# Patient Record
Sex: Female | Born: 1995 | Race: Black or African American | Hispanic: No | Marital: Single | Smoking: Never smoker
Health system: Southern US, Community
[De-identification: ages and names within clinical notes are randomized; demographics above are authoritative.]

## PROBLEM LIST (undated history)

## (undated) DIAGNOSIS — R51 Headache: Secondary | ICD-10-CM

## (undated) HISTORY — DX: Headache: R51

---
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
## 2015-04-10 ENCOUNTER — Emergency Department (HOSPITAL_COMMUNITY)
Admission: EM | Admit: 2015-04-10 | Discharge: 2015-04-10 | Disposition: A | Discharge status: Home / Self Care | Payer: Medicaid Other | Attending: Emergency Medicine | Admitting: Emergency Medicine

## 2015-04-10 ENCOUNTER — Encounter (HOSPITAL_COMMUNITY): Payer: Self-pay | Admitting: Emergency Medicine

## 2015-04-10 DIAGNOSIS — Z792 Long term (current) use of antibiotics: Secondary | ICD-10-CM | POA: Insufficient documentation

## 2015-04-10 DIAGNOSIS — F419 Anxiety disorder, unspecified: Secondary | ICD-10-CM | POA: Insufficient documentation

## 2015-04-10 DIAGNOSIS — L729 Follicular cyst of the skin and subcutaneous tissue, unspecified: Secondary | ICD-10-CM | POA: Diagnosis present

## 2015-04-10 DIAGNOSIS — L0501 Pilonidal cyst with abscess: Secondary | ICD-10-CM | POA: Diagnosis not present

## 2015-04-10 LAB — I-STAT CHEM 8, ED
BUN: 10 mg/dL (ref 6–20)
CALCIUM ION: 1.23 mmol/L (ref 1.12–1.23)
Chloride: 101 mmol/L (ref 101–111)
Creatinine, Ser: 0.7 mg/dL (ref 0.44–1.00)
Glucose, Bld: 104 mg/dL — ABNORMAL HIGH (ref 65–99)
HEMATOCRIT: 35 % — AB (ref 36.0–46.0)
HEMOGLOBIN: 11.9 g/dL — AB (ref 12.0–15.0)
Potassium: 3.8 mmol/L (ref 3.5–5.1)
Sodium: 139 mmol/L (ref 135–145)
TCO2: 24 mmol/L (ref 0–100)

## 2015-04-10 MED ORDER — OXYCODONE-ACETAMINOPHEN 5-325 MG PO TABS
1.0000 | ORAL_TABLET | Freq: Once | ORAL | Status: AC
  Administered 2015-04-10: 1 via ORAL
  Filled 2015-04-10: qty 1

## 2015-04-10 MED ORDER — LIDOCAINE-EPINEPHRINE (PF) 2 %-1:200000 IJ SOLN
10.0000 mL | Freq: Once | INTRAMUSCULAR | Status: AC
  Administered 2015-04-10: 10 mL
  Filled 2015-04-10: qty 20

## 2015-04-10 MED ORDER — ONDANSETRON 4 MG PO TBDP
4.0000 mg | ORAL_TABLET | Freq: Once | ORAL | Status: AC
  Administered 2015-04-10: 4 mg via ORAL
  Filled 2015-04-10: qty 1

## 2015-04-10 MED ORDER — OXYCODONE-ACETAMINOPHEN 5-325 MG PO TABS: 1.0000 | ORAL_TABLET | Freq: Four times a day (QID) | ORAL | Status: DC | PRN

## 2015-04-10 MED ORDER — LIDOCAINE-PRILOCAINE 2.5-2.5 % EX CREA
TOPICAL_CREAM | Freq: Once | CUTANEOUS | Status: AC
  Administered 2015-04-10: 12:00:00 via TOPICAL
  Filled 2015-04-10: qty 5

## 2015-04-10 NOTE — Discharge Instructions (Signed)
°  Pilonidal Cyst, Care After °A pilonidal cyst occurs when hairs get trapped (ingrown) beneath the skin in the crease between the buttocks over your sacrum (the bone under that crease). Pilonidal cysts are most common in young men with a lot of body hair. When the cyst breaks(ruptured) or leaks, fluid from the cyst may cause burning and itching. If the cyst becomes infected, it causes a painful swelling filled with pus (abscess). The pus and trapped hairs need to be removed (often by lancing) so that the infection can heal. The word pilonidal means hair nest. °HOME CARE INSTRUCTIONS °If the pilonidal sinus was NOT DRAINING OR LANCED: °· Keep the area clean and dry. Bathe or shower daily. Wash the area well with a germ-killing soap. Hot tub baths may help prevent infection. Dry the area well with a towel. °· Avoid tight clothing in order to keep area as moisture-free as possible. °· Keep area between buttocks as free from hair as possible. A depilatory may be used. °· Take antibiotics as directed. °· Only take over-the-counter or prescription medicines for pain, discomfort, or fever as directed by your caregiver. °If the cyst WAS INFECTED AND NEEDED TO BE DRAINED: °· Your caregiver may have packed the wound with gauze to keep the wound open. This allows the wound to heal from the inside outward and continue to drain. °· Return as directed for a wound check. °· If you take tub baths or showers, repack the wound with gauze as directed following. Sponge baths are a good alternative. Sitz baths may be used three to four times a day or as directed. °· If an antibiotic was ordered to fight the infection, take as directed. °· Only take over-the-counter or prescription medicines for pain, discomfort, or fever as directed by your caregiver. °· If a drain was in place and removed, use sitz baths for 20 minutes 4 times per day. Clean the wound gently with mild unscented soap, pat dry, and then apply a dry dressing as  directed. °If you had surgery and IT WAS MARSUPIALIZED (LEFT OPEN): °· Your wound was packed with gauze to keep the wound open. This allows the wound to heal from the inside outwards and continue draining. The changing of the dressing regularly also helps keep the wound clean. °· Return as directed for a wound check. °· If you take tub baths or showers, repack the wound with gauze as directed following. Sponge baths are a good alternative. Sitz baths can also be used. This may be done three to four times a day or as directed. °· If an antibiotic was ordered to fight the infection, take as directed. °· Only take over-the-counter or prescription medicines for pain, discomfort, or fever as directed by your caregiver. °· If you had surgery and the wound was closed you may care for it as directed. This generally includes keeping it dry and clean and dressing it as directed. °SEEK MEDICAL CARE IF:  °· You have increased pain, swelling, redness, drainage, or bleeding from the area. °· You have a fever. °· You have muscles aches, dizziness, or a general ill feeling. °Document Released: 11/30/2006 Document Revised: 07/02/2013 Document Reviewed: 02/14/2007 °ExitCare® Patient Information ©2015 [employer]. This information is not intended to replace advice given to you by your health care provider. Make sure you discuss any questions you have with your health care provider. ° ° °

## 2015-04-10 NOTE — ED Provider Notes (Signed)
CSN: [id number]     Arrival date & time 04/10/15  1028 History   First MD Initiated Contact with Patient 04/10/15 1039     Chief Complaint  Patient presents with  . Cyst     (Consider location/radiation/quality/duration/timing/severity/associated sxs/prior Treatment) HPI    PCP: DEES,JANET L, MD Blood pressure 125/66, pulse 73, temperature 98.6 F (37 C), temperature source Oral, resp. rate 20, last menstrual period 03/14/2015, SpO2 98 %.  Erica Montes is a 19 y.o.female with a significant PMH of headache presents to the ER with complaints of fell two weeks ago and landed on her tailbone. She started to develop a pain and bump to the area. She was seen at an ER in [place], xrays and started on abx. She continues to have pain and now is having drainage and worsening pain from the area. She is now having drainage and a foul odor from the site. She has not had fevers, N/V/D, no rectal pain or pain with bowel movement. Denies having this in the past. Denies polyuria or any other associated symptoms. Pain is severe and patient is very anxious.  Past Medical History  Diagnosis Date  . Headache(784.0)    History reviewed. No pertinent past surgical history. Family History  Problem Relation Age of Onset  . Diabetes Other     Maternal side  . High blood pressure Other     Maternal side   . Arthritis Other     Maternal side    History  Substance Use Topics  . Smoking status: Never Smoker   . Smokeless tobacco: Never Used  . Alcohol Use: No   OB History    No data available     Review of Systems  10 Systems reviewed and are negative for acute change except as noted in the HPI.     Allergies  Review of patient's allergies indicates no known allergies.  Home Medications   Prior to Admission medications   Medication Sig Start Date End Date Taking? Authorizing Provider  bacitracin ointment Apply 1 application topically 4 (four) times daily.   Yes Historical  Provider, MD  clindamycin (CLEOCIN) 300 MG capsule Take 300 mg by mouth 4 (four) times daily.   Yes Historical Provider, MD  naproxen sodium (ANAPROX) 220 MG tablet Take 220 mg by mouth 2 (two) times daily as needed (pain).   Yes Historical Provider, MD  traMADol-acetaminophen (ULTRACET) 37.5-325 MG per tablet Take 1 tablet by mouth every 6 (six) hours as needed (pain).   Yes Historical Provider, MD   BP 125/66 mmHg  Pulse 73  Temp(Src) 98.6 F (37 C) (Oral)  Resp 20  SpO2 98%  LMP 03/14/2015 Physical Exam  Constitutional: She appears well-developed and well-nourished. No distress.  HENT:  Head: Normocephalic and atraumatic.  Eyes: Pupils are equal, round, and reactive to light.  Neck: Normal range of motion. Neck supple.  Cardiovascular: Normal rate and regular rhythm.   Pulmonary/Chest: Effort normal.  Abdominal: Soft. Bowel sounds are normal. There is no tenderness. There is no rigidity, no rebound and no guarding.  Genitourinary:  pilonidal abscess that is actively draining. It does not extend into the perineum and no associated cellulitis.  Neurological: She is alert.  Skin: Skin is warm and dry.  Nursing note and vitals reviewed.   ED Course  Procedures (including critical care time) Labs Review Labs Reviewed  I-STAT CHEM 8, ED    Imaging Review No results found.   EKG Interpretation None  MDM   Final diagnoses:  Pilonidal abscess    Patient very anxious about the procedure. Topical EMLA cream applied and Percocet given to help with pain.  INCISION AND DRAINAGE Performed by: Dorthula Matas Consent: Verbal consent obtained. Risks and benefits: risks, benefits and alternatives were discussed Type: abscess  Body area: pilonidal cyst/abscess  Anesthesia: local infiltration  Incision was made with a scalpel.  Local anesthetic:ELMA cream and then some  lidocaine 2% with epinephrine  Anesthetic total: 2 ml  Complexity: complex Blunt dissection  to break up loculations  Drainage: purulent  Drainage amount: moderate  Packing material: 1/4 in iodoform gauze, approx 6 inches  Patient tolerance: Patient tolerated the procedure well with no immediate complications.  Pt to continue abx, discussed with the patient and her mother the need for CCS consult as pilonidal cysts typically need surgical removal ultimately to remove the casing.  As Monday is a holiday, pt has been asked to return to ED on Monday for packing removal and to have abscess re-evaluated.  Medications  lidocaine-EPINEPHrine (XYLOCAINE W/EPI) 2 %-1:200000 (PF) injection 10 mL (10 mLs Other Given 04/10/15 1103)  lidocaine-prilocaine (EMLA) cream ( Topical Given 04/10/15 1151)  oxyCODONE-acetaminophen (PERCOCET/ROXICET) 5-325 MG per tablet 1 tablet (1 tablet Oral Given 04/10/15 1103)  ondansetron (ZOFRAN-ODT) disintegrating tablet 4 mg (4 mg Oral Given 04/10/15 1103)    19 y.o.Erica Montes's evaluation in the Emergency Department is complete. It has been determined that no acute conditions requiring further emergency intervention are present at this time. The patient/guardian have been advised of the diagnosis and plan. We have discussed signs and symptoms that warrant return to the ED, such as changes or worsening in symptoms.  Vital signs are stable at discharge. Filed Vitals:   04/10/15 1043  BP: 125/66  Pulse: 73  Temp: 98.6 F (37 C)  Resp: 20     Patient/guardian has voiced understanding and agreed to follow-up with the PCP or specialist.    Marlon Pel, PA-C 04/10/15 1249  Vanetta Mulders, MD 04/13/15 1319

## 2015-04-10 NOTE — ED Notes (Signed)
Pt fell 2 weeks ago landing on coccyx area and was seen by MD at found that pt has cyst. Pt was started on antibiotic. Tuesday pt started to have draining, odor and a piece of "meat" coming from area.

## 2015-04-12 ENCOUNTER — Emergency Department (HOSPITAL_COMMUNITY)
Admission: EM | Admit: 2015-04-12 | Discharge: 2015-04-12 | Disposition: A | Discharge status: Home / Self Care | Payer: Medicaid Other | Attending: Emergency Medicine | Admitting: Emergency Medicine

## 2015-04-12 ENCOUNTER — Encounter (HOSPITAL_COMMUNITY): Payer: Self-pay | Admitting: Nurse Practitioner

## 2015-04-12 DIAGNOSIS — Z4801 Encounter for change or removal of surgical wound dressing: Secondary | ICD-10-CM | POA: Insufficient documentation

## 2015-04-12 DIAGNOSIS — Z5189 Encounter for other specified aftercare: Secondary | ICD-10-CM

## 2015-04-12 DIAGNOSIS — Z792 Long term (current) use of antibiotics: Secondary | ICD-10-CM | POA: Insufficient documentation

## 2015-04-12 NOTE — ED Notes (Signed)
Pt had wound on buttock drained and packed at her PCP this week. She was told to come to ER today for packing removal. She denies any complaints or pain

## 2015-04-12 NOTE — ED Provider Notes (Signed)
CSN: [id number]     Arrival date & time 04/12/15  1206 History  This chart was scribed for non-physician practitioner, Trixie DredgeEmily Briah Nary, PA-C, working with Mancel BaleElliott Wentz, MD by Charline BillsEssence Howell, ED Scribe. This patient was seen in room TR11C/TR11C and the patient's care was started at 1:09 PM.   Chief Complaint  Patient presents with  . Wound Check   The history is provided by the patient. No language interpreter was used.   HPI Comments: Erica Montes is a 19 y.o. female who presents to the Emergency Department for a wound check. Pt had a pilonidal abscess lanced on 04/10/15 with packing placed. Pt reports that she has been changing gauze regularly. She denies fever, chills, body aches, redness, drainage, pain to the abscess, abdominal pain, back pain, constipation, diarrhea, blood in stools, any urinary symptoms. No h/o previous abscess.   Past Medical History  Diagnosis Date  . Headache(784.0)    History reviewed. No pertinent past surgical history. Family History  Problem Relation Age of Onset  . Diabetes Other     Maternal side  . High blood pressure Other     Maternal side   . Arthritis Other     Maternal side    History  Substance Use Topics  . Smoking status: Never Smoker   . Smokeless tobacco: Never Used  . Alcohol Use: No   OB History    No data available     Review of Systems  Constitutional: Negative for fever and chills.  Gastrointestinal: Negative for abdominal pain, diarrhea, constipation and blood in stool.  Genitourinary: Negative.   Musculoskeletal: Negative for myalgias and back pain.  Skin: Positive for wound. Negative for color change.  Allergic/Immunologic: Negative for immunocompromised state.  Hematological: Does not bruise/bleed easily.  Psychiatric/Behavioral: Negative for self-injury.   Allergies  Review of patient's allergies indicates no known allergies.  Home Medications   Prior to Admission medications   Medication Sig Start Date End  Date Taking? Authorizing Provider  bacitracin ointment Apply 1 application topically 4 (four) times daily.    Historical Provider, MD  clindamycin (CLEOCIN) 300 MG capsule Take 300 mg by mouth 4 (four) times daily.    Historical Provider, MD  naproxen sodium (ANAPROX) 220 MG tablet Take 220 mg by mouth 2 (two) times daily as needed (pain).    Historical Provider, MD  oxyCODONE-acetaminophen (PERCOCET/ROXICET) 5-325 MG per tablet Take 1 tablet by mouth every 6 (six) hours as needed for severe pain. 04/10/15   Marlon Peliffany Greene, PA-C  traMADol-acetaminophen (ULTRACET) 37.5-325 MG per tablet Take 1 tablet by mouth every 6 (six) hours as needed (pain).    Historical Provider, MD   Triage: BP 119/76 mmHg  Pulse 102  Temp(Src) 98.2 F (36.8 C) (Oral)  Resp 18  SpO2 97%  LMP 03/14/2015 Physical Exam  Constitutional: She appears well-developed and well-nourished. No distress.  HENT:  Head: Normocephalic and atraumatic.  Neck: Normal range of motion. Neck supple.  Pulmonary/Chest: Effort normal.  Musculoskeletal: She exhibits no edema.  Neurological: She is alert. She exhibits normal muscle tone.  Skin: She is not diaphoretic.  R pilonidal incision with small amt of serosanguinous drainage after removal of packing. Packing had purulent discharge on it. Base of wound is healthy appearing. No surrounding erythema, edema, warmth or tenderness.   Psychiatric: She has a normal mood and affect. Her behavior is normal.  Nursing note and vitals reviewed.  ED Course  Procedures (including critical care time) DIAGNOSTIC STUDIES: Oxygen Saturation  is 97% on RA, normal by my interpretation.    COORDINATION OF CARE: 1:13 PM-Discussed treatment plan which includes packing removal and follow-up with surgery if needed with pt at bedside and pt agreed to plan.   Labs Review Labs Reviewed - No data to display  Imaging Review No results found.   EKG Interpretation None      MDM   Final diagnoses:   Wound check, abscess    Afebrile, nontoxic patient with drained pilonidal abscess, healing well.  No cellulitis.  No fevers or systemic symptoms.   D/C home with return precautions, wound care instructions.  Discussed result, findings, treatment, and follow up  with patient.  Pt given return precautions.  Pt verbalizes understanding and agrees with plan.       I personally performed the services described in this documentation, which was scribed in my presence. The recorded information has been reviewed and is accurate.    Trixie Dredge, PA-C 04/12/15 1354  Mancel Bale, MD 04/12/15 [phone]

## 2015-04-12 NOTE — ED Notes (Signed)
Gauze and tape applied to wound at top of buttocks.  Pt tolerated well.

## 2015-04-12 NOTE — Discharge Instructions (Signed)
Read the information below.  You may return to the Emergency Department at any time for worsening condition or any new symptoms that concern you.  If you develop redness, swelling, pus draining from the wound, or fevers greater than 100.4, return to the ER immediately for a recheck.   °

## 2015-05-31 ENCOUNTER — Ambulatory Visit (INDEPENDENT_AMBULATORY_CARE_PROVIDER_SITE_OTHER): Payer: Medicaid Other | Admitting: Women's Health

## 2015-05-31 ENCOUNTER — Encounter: Payer: Self-pay | Admitting: Women's Health

## 2015-05-31 VITALS — BP 100/68 | HR 72 | Wt 135.0 lb

## 2015-05-31 DIAGNOSIS — Z3009 Encounter for other general counseling and advice on contraception: Secondary | ICD-10-CM | POA: Diagnosis not present

## 2015-05-31 DIAGNOSIS — Z3202 Encounter for pregnancy test, result negative: Secondary | ICD-10-CM

## 2015-05-31 LAB — POCT URINE PREGNANCY: Preg Test, Ur: NEGATIVE

## 2015-05-31 NOTE — Patient Instructions (Signed)
NO SEX UNTIL AFTER YOU GET YOUR BIRTH CONTROL  Etonogestrel implant What is this medicine? ETONOGESTREL (et oh noe JES trel) is a contraceptive (birth control) device. It is used to prevent pregnancy. It can be used for up to 3 years. This medicine may be used for other purposes; ask your health care provider or pharmacist if you have questions. COMMON BRAND NAME(S): Implanon, Nexplanon What should I tell my health care provider before I take this medicine? They need to know if you have any of these conditions: -abnormal vaginal bleeding -blood vessel disease or blood clots -cancer of the breast, cervix, or liver -depression -diabetes -gallbladder disease -headaches -heart disease or recent heart attack -high blood pressure -high cholesterol -kidney disease -liver disease -renal disease -seizures -tobacco smoker -an unusual or allergic reaction to etonogestrel, other hormones, anesthetics or antiseptics, medicines, foods, dyes, or preservatives -pregnant or trying to get pregnant -breast-feeding How should I use this medicine? This device is inserted just under the skin on the inner side of your upper arm by a health care professional. Talk to your pediatrician regarding the use of this medicine in children. Special care may be needed. Overdosage: If you think you've taken too much of this medicine contact a poison control center or emergency room at once. Overdosage: If you think you have taken too much of this medicine contact a poison control center or emergency room at once. NOTE: This medicine is only for you. Do not share this medicine with others. What if I miss a dose? This does not apply. What may interact with this medicine? Do not take this medicine with any of the following medications: -amprenavir -bosentan -fosamprenavir This medicine may also interact with the following medications: -barbiturate medicines for inducing sleep or treating seizures -certain  medicines for fungal infections like ketoconazole and itraconazole -griseofulvin -medicines to treat seizures like carbamazepine, felbamate, oxcarbazepine, phenytoin, topiramate -modafinil -phenylbutazone -rifampin -some medicines to treat HIV infection like atazanavir, indinavir, lopinavir, nelfinavir, tipranavir, ritonavir -St. John's wort This list may not describe all possible interactions. Give your health care provider a list of all the medicines, herbs, non-prescription drugs, or dietary supplements you use. Also tell them if you smoke, drink alcohol, or use illegal drugs. Some items may interact with your medicine. What should I watch for while using this medicine? This product does not protect you against HIV infection (AIDS) or other sexually transmitted diseases. You should be able to feel the implant by pressing your fingertips over the skin where it was inserted. Tell your doctor if you cannot feel the implant. What side effects may I notice from receiving this medicine? Side effects that you should report to your doctor or health care professional as soon as possible: -allergic reactions like skin rash, itching or hives, swelling of the face, lips, or tongue -breast lumps -changes in vision -confusion, trouble speaking or understanding -dark urine -depressed mood -general ill feeling or flu-like symptoms -light-colored stools -loss of appetite, nausea -right upper belly pain -severe headaches -severe pain, swelling, or tenderness in the abdomen -shortness of breath, chest pain, swelling in a leg -signs of pregnancy -sudden numbness or weakness of the face, arm or leg -trouble walking, dizziness, loss of balance or coordination -unusual vaginal bleeding, discharge -unusually weak or tired -yellowing of the eyes or skin Side effects that usually do not require medical attention (Report these to your doctor or health care professional if they continue or are  bothersome.): -acne -breast pain -changes in weight -cough -  fever or chills -headache -irregular menstrual bleeding -itching, burning, and vaginal discharge -pain or difficulty passing urine -sore throat This list may not describe all possible side effects. Call your doctor for medical advice about side effects. You may report side effects to FDA at 1-800-FDA-1088. Where should I keep my medicine? This drug is given in a hospital or clinic and will not be stored at home. NOTE: This sheet is a summary. It may not cover all possible information. If you have questions about this medicine, talk to your doctor, pharmacist, or health care provider.  2015, Elsevier/Gold Standard. (2012-05-06 15:37:45)  

## 2015-05-31 NOTE — Progress Notes (Signed)
Patient ID: Erica Montes Kaczmarek, female   DOB: 11-30-1995, 19 y.o.   MRN: [id number]   [facility]  Patient name: Erica Montes Roskelley MRN [id number]  Date of birth: 11-30-1995  CC & HPI:  Erica Montes Chill is a 19 y.o. African American female presenting today to discuss starting birth control. Hasn't had sex in months. Not sure what she wants. Doesn't want children any time soon. Discussed all options, she wants to try nexplanon. Discussed r/b.   Pertinent History Reviewed:  Medical & Surgical Hx:   Past Medical History  Diagnosis Date  . Headache(784.0)    History reviewed. No pertinent past surgical history. Medications: Reviewed & Updated - see associated section Social History: Reviewed -  reports that she has never smoked. She has never used smokeless tobacco.  Objective Findings:  Vitals: BP 100/68 mmHg  Pulse 72  Wt 135 lb (61.236 kg)  LMP 05/28/2015  Physical Examination: General appearance - alert, well appearing, and in no distress Heart - normal rate and regular rhythm LCTAB Abdomen - soft, nontender, nondistended, no masses or organomegaly  Results for orders placed or performed in visit on 05/31/15 (from the past 24 hour(s))  POCT urine pregnancy   Collection Time: 05/31/15  4:02 PM  Result Value Ref Range   Preg Test, Ur Negative Negative     Assessment & Plan:  A:   Contraception counseling P:  Order nexplanon today  No sex until after nexplanon placed   F/U 3wks for nexplanon insertion   Marge DuncansBooker, Ebrahim Deremer Randall CNM, [facility]NP-BC 05/31/2015 4:38 PM

## 2015-06-16 ENCOUNTER — Encounter: Payer: Self-pay | Admitting: Women's Health

## 2015-06-16 ENCOUNTER — Encounter: Payer: Medicaid Other | Admitting: Women's Health

## 2015-06-21 ENCOUNTER — Encounter: Payer: Medicaid Other | Admitting: Women's Health

## 2016-02-12 ENCOUNTER — Emergency Department (HOSPITAL_COMMUNITY)
Admission: EM | Admit: 2016-02-12 | Discharge: 2016-02-12 | Disposition: A | Discharge status: Home / Self Care | Payer: BLUE CROSS/BLUE SHIELD | Attending: Emergency Medicine | Admitting: Emergency Medicine

## 2016-02-12 ENCOUNTER — Encounter (HOSPITAL_COMMUNITY): Payer: Self-pay | Admitting: *Deleted

## 2016-02-12 DIAGNOSIS — L0591 Pilonidal cyst without abscess: Secondary | ICD-10-CM | POA: Diagnosis not present

## 2016-02-12 DIAGNOSIS — Z792 Long term (current) use of antibiotics: Secondary | ICD-10-CM | POA: Diagnosis not present

## 2016-02-12 DIAGNOSIS — M791 Myalgia: Secondary | ICD-10-CM | POA: Diagnosis present

## 2016-02-12 MED ORDER — HYDROCODONE-ACETAMINOPHEN 5-325 MG PO TABS
1.0000 | ORAL_TABLET | Freq: Once | ORAL | Status: AC
  Administered 2016-02-12: 1 via ORAL
  Filled 2016-02-12: qty 1

## 2016-02-12 MED ORDER — HYDROCODONE-ACETAMINOPHEN 5-325 MG PO TABS: 2.0000 | ORAL_TABLET | ORAL | Status: DC | PRN

## 2016-02-12 MED ORDER — DOCUSATE SODIUM 100 MG PO CAPS: 100.0000 mg | ORAL_CAPSULE | Freq: Two times a day (BID) | ORAL | Status: DC

## 2016-02-12 NOTE — ED Notes (Signed)
Pt reports an abscess in location at upper buttocks.

## 2016-02-12 NOTE — ED Provider Notes (Signed)
CSN: [id number]     Arrival date & time 02/12/16  0730 History   First MD Initiated Contact with Patient 02/12/16 0745     Chief Complaint  Patient presents with  . Abscess     (Consider location/radiation/quality/duration/timing/severity/associated sxs/prior Treatment) HPI   Erica Montes is a 20 y.o F Presents to the ED with complaint of possible abscess. Patient states that one year ago she was diagnosed with a pilonidal cyst and had this lanced in the emergency room. 3 days ago patient developed pain in the apex of her gluteal cleft. Patient states that she has pain with sitting down. Patient has a scheduled appointment with [facility] on 02/15/2016 for evaluation of pilonidal cyst. However, patient states that her pain was not being relieved by ibuprofen Z came to the ED for further evaluation. She denies melena, hematochezia, abdominal pain, fevers, chills.   Past Medical History  Diagnosis Date  . Headache(784.0)    History reviewed. No pertinent past surgical history. Family History  Problem Relation Age of Onset  . Diabetes Other     Maternal side  . High blood pressure Other     Maternal side   . Arthritis Other     Maternal side    Social History  Substance Use Topics  . Smoking status: Never Smoker   . Smokeless tobacco: Never Used  . Alcohol Use: No   OB History    No data available     Review of Systems  All other systems reviewed and are negative.     Allergies  Review of patient's allergies indicates no known allergies.  Home Medications   Prior to Admission medications   Medication Sig Start Date End Date Taking? Authorizing Provider  bacitracin ointment Apply 1 application topically 4 (four) times daily.    Historical Provider, MD  clindamycin (CLEOCIN) 300 MG capsule Take 300 mg by mouth 4 (four) times daily.    Historical Provider, MD  naproxen sodium (ANAPROX) 220 MG tablet Take 220 mg by mouth 2 (two) times daily as needed  (pain).    Historical Provider, MD  oxyCODONE-acetaminophen (PERCOCET/ROXICET) 5-325 MG per tablet Take 1 tablet by mouth every 6 (six) hours as needed for severe pain. Patient not taking: Reported on 05/31/2015 04/10/15   Marlon Pel, PA-C  traMADol-acetaminophen (ULTRACET) 37.5-325 MG per tablet Take 1 tablet by mouth every 6 (six) hours as needed (pain).    Historical Provider, MD   BP 123/74 mmHg  Pulse 83  Temp(Src) 98.1 F (36.7 C) (Oral)  Resp 20  Ht  (1.626 m)  Wt 65.772 kg  BMI 24.88 kg/m2  SpO2 99%  LMP 01/31/2016 Physical Exam  Constitutional: She is oriented to person, place, and time. She appears well-developed and well-nourished. No distress.  HENT:  Head: Normocephalic and atraumatic.  Eyes: Conjunctivae are normal. Right eye exhibits no discharge. Left eye exhibits no discharge. No scleral icterus.  Cardiovascular: Normal rate.   Pulmonary/Chest: Effort normal.  Genitourinary:  TTP at apex of gluteal cleft. No perirectal tenderness or fluctuance. No erythema or warmth.  Musculoskeletal:       Back:  Neurological: She is alert and oriented to person, place, and time. Coordination normal.  Skin: Skin is warm and dry. No rash noted. She is not diaphoretic. No erythema. No pallor.  Psychiatric: She has a normal mood and affect. Her behavior is normal.  Nursing note and vitals reviewed.   ED Course  Procedures (including critical care time)  Labs Review Labs Reviewed - No data to display  Imaging Review No results found. I have personally reviewed and evaluated these images and lab results as part of my medical decision-making.   EKG Interpretation None      MDM   Final diagnoses:  Non-infected pilonidal cyst    Patient with likely recurrence of pilonidal cyst. Does not appear to be infected at this time. Patient is afebrile and in no apparent distress. No redness or swelling. No perirectal tenderness or fluctuance. Doubt deep space or perirectal  abscess. Tenderness is located at the apex of gluteal cleft. Patient has scheduled an appointment with [facility] in 3 days for consultation and likely excision of her pilonidal cyst. We will manage patient's pain until this scheduled appointment. Strict return precautions given.    Lester KinsmanSamantha Tripp [place], PA-C 02/12/16 [id number]  Nelva Nayobert Beaton, MD 02/17/16 [id number]

## 2016-02-12 NOTE — Discharge Instructions (Signed)
Pilonidal Cyst A pilonidal cyst is a fluid-filled sac. It forms beneath the skin near your tailbone, at the top of the crease of your buttocks. A pilonidal cyst that is not large or infected may not cause symptoms or problems. If the cyst becomes irritated or infected, it may fill with pus. This causes pain and swelling (pilonidal abscess). An infected cyst may need to be treated with medicine, drained, or removed. CAUSES The cause of a pilonidal cyst is not known. One cause may be a hair that grows into your skin (ingrown hair). RISK FACTORS Pilonidal cysts are more common in boys and men. Risk factors include:  Having lots of hair near the crease of the buttocks.  Being overweight.  Having a pilonidal dimple.  Wearing tight clothing.  Not bathing or showering frequently.  Sitting for long periods of time. SIGNS AND SYMPTOMS Signs and symptoms of a pilonidal cyst may include:  Redness.  Pain and tenderness.  Warmth.  Swelling.  Pus.  Fever. DIAGNOSIS Your health care provider may diagnose a pilonidal cyst based on your symptoms and a physical exam. The health care provider may do a blood test to check for infection. If your cyst is draining pus, your health care provider may take a sample of the drainage to be tested at a laboratory. TREATMENT Surgery is the usual treatment for an infected pilonidal cyst. You may also have to take medicines before surgery. The type of surgery you have depends on the size and severity of the infected cyst. The different kinds of surgery include:  Incision and drainage. This is a procedure to open and drain the cyst.  Marsupialization. In this procedure, a large cyst or abscess may be opened and kept open by stitching the edges of the skin to the cyst walls.  Cyst removal. This procedure involves opening the skin and removing all or part of the cyst. HOME CARE INSTRUCTIONS  Follow all of your surgeon's instructions carefully if you had  surgery.  Take medicines only as directed by your health care provider.  If you were prescribed an antibiotic medicine, finish it all even if you start to feel better.  Keep the area around your pilonidal cyst clean and dry.  Clean the area as directed by your health care provider. Pat the area dry with a clean towel. Do not rub it as this may cause bleeding.  Remove hair from the area around the cyst as directed by your health care provider.  Do not wear tight clothing or sit in one place for long periods of time.  There are many different ways to close and cover an incision, including stitches, skin glue, and adhesive strips. Follow your health care provider's instructions on:  Incision care.  Bandage (dressing) changes and removal.  Incision closure removal. SEEK MEDICAL CARE IF:   You have drainage, redness, swelling, or pain at the site of the cyst.  You have a fever.   This information is not intended to replace advice given to you by your health care provider. Make sure you discuss any questions you have with your health care provider.  [facility]. Take pain medications as needed. Return to the ED if you experience severe worsening of your pain, inability to have bowel movement, fever, chills, redness or swelling around affected area.

## 2016-02-12 NOTE — ED Notes (Signed)
Declined W/C at D/C and was escorted to lobby by RN. 

## 2018-05-28 ENCOUNTER — Ambulatory Visit: Payer: Self-pay | Admitting: General Surgery

## 2018-05-28 NOTE — H&P (Signed)
History of Present Illness Erica Montes(Rosene Pilling MD; 05/28/2018 9:42 AM) The patient is a 22 year old female who presents with a pilonidal cyst. This is a healthy 22 year old female. She was seen in the office in April 2017 for her first pilonidal abscess which required bedside I&D x 2 to fully heal. Since then, she will have a "flare up" with pain in the area about every other month, but these resolve with warm baths. However,several weeks ago, the pain and swelling increased so she came to urgent office for evaluation. Due to nervousness regarding the numbing process, she was placed on antibiotics since there was no cellulitis or fluctuance. She states that her pain resolved. She is currently assymptomatic.    Problem List/Past Medical Erica Montes(Honora Searson, MD; 05/28/2018 9:43 AM) Macario CarlsPILONIDAL ABSCESS (L05.01)  Past Surgical History Erica Montes(Mong Neal, MD; 05/28/2018 9:43 AM) No pertinent past surgical history  Diagnostic Studies History Erica Montes(Shevaun Lovan, MD; 05/28/2018 9:43 AM) Colonoscopy never Mammogram never Pap Smear never  Allergies (Tanisha A. Manson PasseyBrown, RMA; 05/28/2018 9:32 AM) No Known Drug Allergies [02/15/2016]: Allergies Reconciled  Medication History (Tanisha A. Manson PasseyBrown, RMA; 05/28/2018 9:32 AM) No Current Medications (Taken starting 02/21/2016) Medications Reconciled  Social History Erica Montes(Celestina Gironda, MD; 05/28/2018 9:43 AM) No alcohol use No caffeine use No drug use Tobacco use Never smoker.  Family History Erica Montes(Randee Huston, MD; 05/28/2018 9:43 AM) Diabetes Mellitus Family Members In General. Hypertension Family Members In General.  Pregnancy / Birth History Erica Montes(Jhayla Podgorski, MD; 05/28/2018 9:43 AM) Age at menarche 12 years. Regular periods  Other Problems Erica Montes(Aliciana Ricciardi, MD; 05/28/2018 9:43 AM) Migraine Headache     Review of Systems Erica Montes(Xxavier Noon MD; 05/28/2018 9:43 AM) General Present- Appetite Loss, Chills, Fatigue, Fever and Night Sweats. Not Present- Weight Gain  and Weight Loss. Skin Not Present- Change in Wart/Mole, Dryness, Hives, Jaundice, New Lesions, Non-Healing Wounds, Rash and Ulcer. HEENT Not Present- Earache, Hearing Loss, Hoarseness, Nose Bleed, Oral Ulcers, Ringing in the Ears, Seasonal Allergies, Sinus Pain, Sore Throat, Visual Disturbances, Wears glasses/contact lenses and Yellow Eyes. Respiratory Not Present- Bloody sputum, Chronic Cough, Difficulty Breathing, Snoring and Wheezing. Breast Not Present- Breast Mass, Breast Pain, Nipple Discharge and Skin Changes. Cardiovascular Not Present- Chest Pain, Difficulty Breathing Lying Down, Leg Cramps, Palpitations, Rapid Heart Rate, Shortness of Breath and Swelling of Extremities. Gastrointestinal Not Present- Abdominal Pain, Bloating, Bloody Stool, Change in Bowel Habits, Chronic diarrhea, Constipation, Difficulty Swallowing, Excessive gas, Gets full quickly at meals, Hemorrhoids, Indigestion, Nausea, Rectal Pain and Vomiting. Female Genitourinary Not Present- Frequency, Nocturia, Painful Urination, Pelvic Pain and Urgency. Musculoskeletal Not Present- Back Pain, Joint Pain, Joint Stiffness, Muscle Pain, Muscle Weakness and Swelling of Extremities. Neurological Present- Trouble walking. Not Present- Decreased Memory, Fainting, Headaches, Numbness, Seizures, Tingling, Tremor and Weakness. Psychiatric Not Present- Anxiety, Bipolar, Change in Sleep Pattern, Depression, Fearful and Frequent crying. Endocrine Not Present- Cold Intolerance, Excessive Hunger, Hair Changes, Heat Intolerance, Hot flashes and New Diabetes. Hematology Not Present- Easy Bruising, Excessive bleeding, Gland problems, HIV and Persistent Infections.  Vitals (Tanisha A. Brown RMA; 05/28/2018 9:31 AM) 05/28/2018 9:30 AM Weight: 154.6 lb Height: 64in Body Surface Area: 1.75 m Body Mass Index: 26.54 kg/m  Temp.: 98.78F  Pulse: 78 (Regular)  BP: 126/78 (Sitting, Left Arm, Standard)      Physical Exam Erica Montes(Kase Shughart  MD; 05/28/2018 9:42 AM)  The physical exam findings are as follows: Note:GENERAL: Well-developed, well nourished female in no acute distress  EYES: No scleral icterus Pupils equal, lids normal  EXTERNAL EARS:  Intact, no masses or lesions EXTERNAL NOSE: Intact, no masses or lesions MOUTH: Lips - no lesions Dentition - normal for age  RESPIRATORY: Normal effort, no use of accessory muscles  MUSCULOSKELETAL: Normal gait Grossly normal ROM upper extremities Grossly normal ROM lower extremities  SKIN: Warm and dry Not diaphoretic  PSYCHIATRIC: Normal judgement and insight Normal mood and affect Alert, oriented x 3  Integumentary Note: Superior gluteal cleft: There is a vertical scar from prior I&D nontender There are several pilonidal pits/dimples in the gluteal fold There is no overlyling erythema, induration, or fluctuance. 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 Her most recent infection was concentrated to the left side. We discussed the procedure of resection of her pilonidal disease. We discussed the need for a drain and sutures that will be removed at 1 and 3 weeks postoperatively. We discussed the typical postoperative pain. We discussed the recurrence rates. I believe she understands this and wishes to proceed with surgery.

## 2020-11-16 ENCOUNTER — Ambulatory Visit
Admission: EM | Admit: 2020-11-16 | Discharge: 2020-11-16 | Disposition: A | Discharge status: Home / Self Care | Payer: BLUE CROSS/BLUE SHIELD | Attending: Emergency Medicine | Admitting: Emergency Medicine

## 2020-11-16 ENCOUNTER — Other Ambulatory Visit: Payer: Self-pay

## 2020-11-16 DIAGNOSIS — L0591 Pilonidal cyst without abscess: Secondary | ICD-10-CM | POA: Diagnosis not present

## 2020-11-16 MED ORDER — HYDROCODONE-ACETAMINOPHEN 5-325 MG PO TABS: 1.0000 | ORAL_TABLET | Freq: Four times a day (QID) | ORAL | 0 refills | Status: DC | PRN

## 2020-11-16 MED ORDER — DOXYCYCLINE HYCLATE 100 MG PO CAPS: 100.0000 mg | ORAL_CAPSULE | Freq: Two times a day (BID) | ORAL | 0 refills | Status: AC

## 2020-11-16 MED ORDER — IBUPROFEN 800 MG PO TABS: 800.0000 mg | ORAL_TABLET | Freq: Three times a day (TID) | ORAL | 0 refills | Status: DC

## 2020-11-16 NOTE — ED Triage Notes (Signed)
Pt c/o a cyst on tailbone x 3 days. Pt states she injured her tailbone in 2016. She states the cyst comes and goes occasionally. Pt states it hurts to sit.

## 2020-11-16 NOTE — Discharge Instructions (Signed)
Begin doxycycline twice daily for 1 week Warm compresses/soaks and warm water Ibuprofen and Tylenol for pain mild to moderate, during the day Hydrocodone for severe pain/bedtime Please follow-up with [facility] as planned on Thursday

## 2020-11-16 NOTE — ED Notes (Signed)
Called pt in waiting area 2 times. No answer

## 2020-11-17 NOTE — ED Provider Notes (Signed)
[facility]    CSN: [id number] Arrival date & time: 11/16/20  1544      History   Chief Complaint Chief Complaint  Patient presents with  . Tailbone Pain    HPI Erica Montes is a 25 y.o. female presenting today for evaluation of pilonidal cyst.  Patient reports that over the past 2 to 3 days she has had increased pain and discomfort in her tailbone.  Reports that she has history of pilonidal cyst requiring I&D.  Has plans to follow-up with [facility] on Thursday, but due to pain could not wait this long.  She denies any fevers.  HPI  Past Medical History:  Diagnosis Date  . QBHALPFX(902.4)     Patient Active Problem List   Diagnosis Date Noted  . Migraine without aura, without mention of intractable migraine without mention of status migrainosus 07/27/2014    History reviewed. No pertinent surgical history.  OB History   No obstetric history on file.      Home Medications    Prior to Admission medications   Medication Sig Start Date End Date Taking? Authorizing Provider  doxycycline (VIBRAMYCIN) 100 MG capsule Take 1 capsule (100 mg total) by mouth 2 (two) times daily for 7 days. 11/16/20 11/23/20 Yes Shirley Decamp C, PA-C  HYDROcodone-acetaminophen (NORCO/VICODIN) 5-325 MG tablet Take 1-2 tablets by mouth every 6 (six) hours as needed for severe pain. 11/16/20  Yes Marcelis Wissner C, PA-C  ibuprofen (ADVIL) 800 MG tablet Take 1 tablet (800 mg total) by mouth 3 (three) times daily. 11/16/20  Yes Yoneko Talerico C, PA-C  bacitracin ointment Apply 1 application topically 4 (four) times daily.    [provider]  clindamycin (CLEOCIN) 300 MG capsule Take 300 mg by mouth 4 (four) times daily.    [provider]  docusate sodium (COLACE) 100 MG capsule Take 1 capsule (100 mg total) by mouth every 12 (twelve) hours. 02/12/16   Dowless, Lelon Mast Tripp, PA-C  naproxen sodium (ANAPROX) 220 MG tablet Take 220 mg by mouth 2 (two) times  daily as needed (pain).    [provider]    Family History Family History  Problem Relation Age of Onset  . Diabetes Other        Maternal side  . High blood pressure Other        Maternal side   . Arthritis Other        Maternal side     Social History Social History   Tobacco Use  . Smoking status: Never Smoker  . Smokeless tobacco: Never Used  Substance Use Topics  . Alcohol use: No  . Drug use: No     Allergies   Patient has no known allergies.   Review of Systems Review of Systems  Constitutional: Negative for fatigue and fever.  HENT: Negative for mouth sores.   Eyes: Negative for visual disturbance.  Respiratory: Negative for shortness of breath.   Cardiovascular: Negative for chest pain.  Gastrointestinal: Negative for abdominal pain, nausea and vomiting.  Genitourinary: Negative for genital sores.  Musculoskeletal: Negative for arthralgias and joint swelling.  Skin: Positive for color change. Negative for rash and wound.  Neurological: Negative for dizziness, weakness, light-headedness and headaches.     Physical Exam Triage Vital Signs ED Triage Vitals  Enc Vitals Group     BP 11/16/20 1853 (!) 141/95     Pulse Rate 11/16/20 1853 (!) 111     Resp 11/16/20 1853 17  Temp 11/16/20 1853 97.8 F (36.6 C)     Temp Source 11/16/20 1853 Oral     SpO2 11/16/20 1853 99 %     Weight --      Height --      Head Circumference --      Peak Flow --      Pain Score 11/16/20 1851 9     Pain Loc --      Pain Edu? --      Excl. in [place]? --    No data found.  Updated Vital Signs BP (!) 141/95 (BP Location: Left Arm)   Pulse (!) 111 Comment: pt is standing  Temp 97.8 F (36.6 C) (Oral)   Resp 17   LMP 10/27/2020 (Approximate)   SpO2 99%   Visual Acuity Right Eye Distance:   Left Eye Distance:   Bilateral Distance:    Right Eye Near:   Left Eye Near:    Bilateral Near:     Physical Exam Vitals and nursing note reviewed.   Constitutional:      Appearance: She is well-developed and well-nourished.     Comments: No acute distress  HENT:     Head: Normocephalic and atraumatic.     Nose: Nose normal.  Eyes:     Conjunctiva/sclera: Conjunctivae normal.  Cardiovascular:     Rate and Rhythm: Normal rate.  Pulmonary:     Effort: Pulmonary effort is normal. No respiratory distress.  Abdominal:     General: There is no distension.  Musculoskeletal:        General: Normal range of motion.     Cervical back: Neck supple.     Comments: Sitting leaning to the side, avoiding pressure to tailbone  Skin:    General: Skin is warm and dry.     Comments: Pilonidal area with mild induration noted more prominently in right upper gluteal area, multiple notable openings from prior I&D.  No specific pocket of fluctuance palpated  Neurological:     Mental Status: She is alert and oriented to person, place, and time.  Psychiatric:        Mood and Affect: Mood and affect normal.      UC Treatments / Results  Labs (all labs ordered are listed, but only abnormal results are displayed) Labs Reviewed - No data to display  EKG   Radiology No results found.  Procedures Procedures (including critical care time)  Medications Ordered in UC Medications - No data to display  Initial Impression / Assessment and Plan / UC Course  I have reviewed the triage vital signs and the nursing notes.  Pertinent labs & imaging results that were available during my care of the patient were reviewed by me and considered in my medical decision making (see chart for details).     Given appearance on exam, symptoms for 2 days and area opting to defer I&D today and will have patient follow-up with [facility] as planned in 2 days.  No obvious superficial pus pocket to drain today.  Initiating on doxycycline and recommending warm compresses.  Pain control with Tylenol ibuprofen for mild to moderate pain, hydrocodone for severe pain.   Work note provided to excuse until follow-up appointment in 2 days.  Discussed strict return precautions. Patient verbalized understanding and is agreeable with plan.  Final Clinical Impressions(s) / UC Diagnoses   Final diagnoses:  Pilonidal cyst     Discharge Instructions     Begin doxycycline twice daily for 1  week Warm compresses/soaks and warm water Ibuprofen and Tylenol for pain mild to moderate, during the day Hydrocodone for severe pain/bedtime Please follow-up with [facility] as planned on Thursday    ED Prescriptions    Medication Sig Dispense Auth. Provider   doxycycline (VIBRAMYCIN) 100 MG capsule Take 1 capsule (100 mg total) by mouth 2 (two) times daily for 7 days. 14 capsule Jamario Colina C, PA-C   ibuprofen (ADVIL) 800 MG tablet Take 1 tablet (800 mg total) by mouth 3 (three) times daily. 21 tablet Javier Gell C, PA-C   HYDROcodone-acetaminophen (NORCO/VICODIN) 5-325 MG tablet Take 1-2 tablets by mouth every 6 (six) hours as needed for severe pain. 10 tablet Ad Guttman, [place] C, PA-C     I have reviewed the PDMP during this encounter.   Lew Dawes, PA-C 11/17/20 1015

## 2021-05-15 ENCOUNTER — Ambulatory Visit
Admission: EM | Admit: 2021-05-15 | Discharge: 2021-05-15 | Disposition: A | Discharge status: Home / Self Care | Payer: BC Managed Care – PPO | Attending: Family Medicine | Admitting: Family Medicine

## 2021-05-15 ENCOUNTER — Other Ambulatory Visit: Payer: Self-pay

## 2021-05-15 DIAGNOSIS — L0501 Pilonidal cyst with abscess: Secondary | ICD-10-CM | POA: Diagnosis not present

## 2021-05-15 MED ORDER — HYDROCODONE-ACETAMINOPHEN 5-325 MG PO TABS: 1.0000 | ORAL_TABLET | Freq: Three times a day (TID) | ORAL | 0 refills | Status: DC | PRN

## 2021-05-15 MED ORDER — DOXYCYCLINE HYCLATE 100 MG PO CAPS: 100.0000 mg | ORAL_CAPSULE | Freq: Two times a day (BID) | ORAL | 0 refills | Status: DC

## 2021-05-15 NOTE — Discharge Instructions (Addendum)
Change dressing as often as needed.  Okay to leave packing in until you see General surgery on Tuesday.  Antibiotic as prescribed.  Take care  Dr. Adriana Simas

## 2021-05-15 NOTE — ED Triage Notes (Signed)
Patient presents with complaints  of recurrent abscess at the top of her buttocks that appeared about 5 days ago.

## 2021-05-15 NOTE — ED Provider Notes (Signed)
[facility]    CSN: [id number] Arrival date & time: 05/15/21  0954      History   Chief Complaint Chief Complaint  Patient presents with   Abscess    Top of rectum x  5 dys    HPI  25 year old female presents with abscess.  This is a recurrent issue for the patient.  She has a history of recurrent pilonidal abscess.  Patient states that this has been bothering her since Wednesday of this week.  Has now gotten acutely worse.  Pain 8/10 in severity.  No relieving factors.  No fever.  It has been approximately 6 months since she has had something like this.  No other complaints or concerns at this time.  Past Medical History:  Diagnosis Date   Headache(784.0)     Patient Active Problem List   Diagnosis Date Noted   Migraine without aura, without mention of intractable migraine without mention of status migrainosus 07/27/2014    History reviewed. No pertinent surgical history.  OB History   No obstetric history on file.      Home Medications    Prior to Admission medications   Medication Sig Start Date End Date Taking? Authorizing Provider  acetaminophen (TYLENOL) 500 MG tablet Take 500 mg by mouth every 6 (six) hours as needed.   Yes [provider]  doxycycline (VIBRAMYCIN) 100 MG capsule Take 1 capsule (100 mg total) by mouth 2 (two) times daily. 05/15/21  Yes Karisha Marlin G, DO  HYDROcodone-acetaminophen (NORCO/VICODIN) 5-325 MG tablet Take 1 tablet by mouth every 8 (eight) hours as needed for severe pain or moderate pain. 05/15/21  Yes Tommie Sams, DO    Family History Family History  Problem Relation Age of Onset   Diabetes Other        Maternal side   High blood pressure Other        Maternal side    Arthritis Other        Maternal side     Social History Social History   Tobacco Use   Smoking status: Never   Smokeless tobacco: Never  Vaping Use   Vaping Use: Never used  Substance Use Topics   Alcohol use: No   Drug use: No      Allergies   Patient has no known allergies.   Review of Systems Review of Systems Per HPI  Physical Exam Triage Vital Signs ED Triage Vitals  Enc Vitals Group     BP 05/15/21 1014 134/77     Pulse Rate 05/15/21 1014 (!) 106     Resp 05/15/21 1014 20     Temp 05/15/21 1014 98.7 F (37.1 C)     Temp Source 05/15/21 1014 Oral     SpO2 05/15/21 1014 98 %     Weight --      Height --      Head Circumference --      Peak Flow --      Pain Score 05/15/21 1017 8     Pain Loc --      Pain Edu? --      Excl. in [place]? --    Updated Vital Signs BP 134/77 (BP Location: Left Arm)   Pulse (!) 106   Temp 98.7 F (37.1 C) (Oral)   Resp 20   LMP 05/08/2021 (Exact Date)   SpO2 98%   Visual Acuity Right Eye Distance:   Left Eye Distance:   Bilateral Distance:  Right Eye Near:   Left Eye Near:    Bilateral Near:     Physical Exam Vitals and nursing note reviewed.  Constitutional:      General: She is not in acute distress.    Appearance: Normal appearance.  HENT:     Head: Normocephalic and atraumatic.  Eyes:     General:        Right eye: No discharge.        Left eye: No discharge.     Conjunctiva/sclera: Conjunctivae normal.  Pulmonary:     Effort: Pulmonary effort is normal. No respiratory distress.  Skin:         Comments: Large, fluctuant and tender abscess at the labeled location.  Neurological:     Mental Status: She is alert.  Psychiatric:        Mood and Affect: Mood normal.        Behavior: Behavior normal.     UC Treatments / Results  Labs (all labs ordered are listed, but only abnormal results are displayed) Labs Reviewed - No data to display  EKG   Radiology No results found.  Procedures Incision and Drainage  Date/Time: 05/15/2021 11:22 AM Performed by: Tommie Sams, DO Authorized by: Tommie Sams, DO   Consent:    Consent obtained:  Verbal   Consent given by:  Patient Location:    Type:  Abscess   Location:   Anogenital   Anogenital location:  Pilonidal Pre-procedure details:    Skin preparation:  Povidone-iodine Anesthesia:    Anesthesia method:  Local infiltration   Local anesthetic:  Lidocaine 2% WITH epi Procedure type:    Complexity:  Complex Procedure details:    Incision types:  Stab incision   Wound management:  Probed and deloculated   Drainage:  Purulent   Drainage amount:  Copious   Wound treatment:  Wound left open   Packing materials:  1/4 in iodoform gauze Post-procedure details:    Procedure completion:  Tolerated with difficulty (including critical care time)  Medications Ordered in UC Medications - No data to display  Initial Impression / Assessment and Plan / UC Course  I have reviewed the triage vital signs and the nursing notes.  Pertinent labs & imaging results that were available during my care of the patient were reviewed by me and considered in my medical decision making (see chart for details).    25 year old female presents with a pilonidal abscess.  Incision and drainage performed today.  Copious drainage.  Wound was packed.  She has an upcoming visit with general surgery on Tuesday.  Packing can remain in until that time.  Placing on doxycycline.  Hydrocodone as needed for pain.  Final Clinical Impressions(s) / UC Diagnoses   Final diagnoses:  Pilonidal abscess     Discharge Instructions      Change dressing as often as needed.  Okay to leave packing in until you see General surgery on Tuesday.  Antibiotic as prescribed.  Take care  Dr. Adriana Simas    ED Prescriptions     Medication Sig Dispense Auth. Provider   doxycycline (VIBRAMYCIN) 100 MG capsule Take 1 capsule (100 mg total) by mouth 2 (two) times daily. 20 capsule Jaymeson Mengel G, DO   HYDROcodone-acetaminophen (NORCO/VICODIN) 5-325 MG tablet Take 1 tablet by mouth every 8 (eight) hours as needed for severe pain or moderate pain. 10 tablet Everlene Other G, DO      I have reviewed the PDMP  during this  encounter.   Tommie Sams, [place] 05/15/21 1129

## 2021-06-16 ENCOUNTER — Ambulatory Visit: Payer: Self-pay | Admitting: General Surgery

## 2021-06-16 NOTE — H&P (Signed)
  Expand AllCollapse All          PROVIDER:  Elenora Gamma, MD   MRN: [id number] DOB: [birth year] DATE OF ENCOUNTER: 06/16/2021   Subjective    Chief Complaint: Pilonidal Cyst       History of Present Illness: Erica Montes is a 25 y.o. female who is seen today as an office consultation for evaluation of Pilonidal Cyst She has had multiple recurrences over the past few years.  The most recent being approximately 1 month ago.  She is here to discuss surgical excision.         Review of Systems: A complete review of systems was obtained from the patient.  I have reviewed this information and discussed as appropriate with the patient.  See HPI as well for other ROS.   Medical History: Past Medical History  History reviewed. No pertinent past medical history.        Patient Active Problem List  Diagnosis   Migraine without aura      Past Surgical History  History reviewed. No pertinent surgical history.      Allergies  No Known Allergies           Current Outpatient Medications on File Prior to Visit  Medication Sig Dispense Refill   doxycycline calcium (VIBRAMYCIN) 50 mg/5 mL syrup Take 50 mg by mouth once daily (Patient not taking: Reported on 06/16/2021)       HYDROcodone-acetaminophen (NORCO) 5-325 mg tablet Take 1 tablet by mouth every 6 (six) hours as needed for Pain (Patient not taking: Reported on 06/16/2021)        No current facility-administered medications on file prior to visit.      Family History  History reviewed. No pertinent family history.      Social History       Tobacco Use  Smoking Status Never Smoker  Smokeless Tobacco Never Used      Social History  Social History        Socioeconomic History   Marital status: Married  Tobacco Use   Smoking status: Never Smoker   Smokeless tobacco: Never Used  Substance and Sexual Activity   Alcohol use: Never   Drug use: Never        Objective:      There were no vitals  filed for this visit.    Exam Gen: NAD Abd: soft Right-sided pilonidal disease with at least 1 large pilonidal pit at midline.  Her incision has healed from her most recent drainage.         Assessment and Plan:  Diagnoses and all orders for this visit:   Pilonidal abscess     Given the recurrent nature of her disease, I have recommended excision of her pilonidal disease through a lateral approach.  We have discussed this in detail, including time off of work (2 weeks), typical postoperative pain and scarring.  We discussed activities that she should avoid in the perioperative period.  We discussed recurrence rates and risk of postoperative infection as well.  All questions were answered.   No follow-ups on file.

## 2021-06-27 ENCOUNTER — Encounter: Payer: BC Managed Care – PPO | Admitting: Obstetrics & Gynecology

## 2021-10-20 ENCOUNTER — Ambulatory Visit
Admission: EM | Admit: 2021-10-20 | Discharge: 2021-10-20 | Disposition: A | Discharge status: Home / Self Care | Payer: BC Managed Care – PPO

## 2021-10-20 ENCOUNTER — Encounter (HOSPITAL_COMMUNITY): Payer: Self-pay | Admitting: *Deleted

## 2021-10-20 ENCOUNTER — Emergency Department (HOSPITAL_COMMUNITY): Payer: BC Managed Care – PPO

## 2021-10-20 ENCOUNTER — Other Ambulatory Visit: Payer: Self-pay

## 2021-10-20 ENCOUNTER — Emergency Department (HOSPITAL_COMMUNITY)
Admission: EM | Admit: 2021-10-20 | Discharge: 2021-10-20 | Disposition: A | Discharge status: Home / Self Care | Payer: BC Managed Care – PPO | Attending: Emergency Medicine | Admitting: Emergency Medicine

## 2021-10-20 DIAGNOSIS — R509 Fever, unspecified: Secondary | ICD-10-CM

## 2021-10-20 DIAGNOSIS — N9489 Other specified conditions associated with female genital organs and menstrual cycle: Secondary | ICD-10-CM | POA: Insufficient documentation

## 2021-10-20 DIAGNOSIS — L0501 Pilonidal cyst with abscess: Secondary | ICD-10-CM | POA: Diagnosis present

## 2021-10-20 LAB — CBC WITH DIFFERENTIAL/PLATELET
Abs Immature Granulocytes: 0.07 10*3/uL (ref 0.00–0.07)
Basophils Absolute: 0 10*3/uL (ref 0.0–0.1)
Basophils Relative: 0 %
Eosinophils Absolute: 0 10*3/uL (ref 0.0–0.5)
Eosinophils Relative: 0 %
HCT: 35.1 % — ABNORMAL LOW (ref 36.0–46.0)
Hemoglobin: 12 g/dL (ref 12.0–15.0)
Immature Granulocytes: 1 %
Lymphocytes Relative: 12 %
Lymphs Abs: 1.6 10*3/uL (ref 0.7–4.0)
MCH: 27.1 pg (ref 26.0–34.0)
MCHC: 34.2 g/dL (ref 30.0–36.0)
MCV: 79.4 fL — ABNORMAL LOW (ref 80.0–100.0)
Monocytes Absolute: 1.2 10*3/uL — ABNORMAL HIGH (ref 0.1–1.0)
Monocytes Relative: 9 %
Neutro Abs: 10.4 10*3/uL — ABNORMAL HIGH (ref 1.7–7.7)
Neutrophils Relative %: 78 %
Platelets: 362 10*3/uL (ref 150–400)
RBC: 4.42 MIL/uL (ref 3.87–5.11)
RDW: 12.9 % (ref 11.5–15.5)
WBC: 13.4 10*3/uL — ABNORMAL HIGH (ref 4.0–10.5)
nRBC: 0 % (ref 0.0–0.2)

## 2021-10-20 LAB — COMPREHENSIVE METABOLIC PANEL
ALT: 11 U/L (ref 0–44)
AST: 17 U/L (ref 15–41)
Albumin: 3.1 g/dL — ABNORMAL LOW (ref 3.5–5.0)
Alkaline Phosphatase: 58 U/L (ref 38–126)
Anion gap: 10 (ref 5–15)
BUN: 12 mg/dL (ref 6–20)
CO2: 24 mmol/L (ref 22–32)
Calcium: 9 mg/dL (ref 8.9–10.3)
Chloride: 100 mmol/L (ref 98–111)
Creatinine, Ser: 0.82 mg/dL (ref 0.44–1.00)
GFR, Estimated: >60 mL/min (ref >60)
Glucose, Bld: 135 mg/dL — ABNORMAL HIGH (ref 70–99)
Potassium: 3.9 mmol/L (ref 3.5–5.1)
Sodium: 134 mmol/L — ABNORMAL LOW (ref 135–145)
Total Bilirubin: 0.4 mg/dL (ref 0.3–1.2)
Total Protein: 6.8 g/dL (ref 6.5–8.1)

## 2021-10-20 LAB — I-STAT BETA HCG BLOOD, ED (MC, WL, AP ONLY): I-stat hCG, quantitative: <5 m[IU]/mL (ref <5)

## 2021-10-20 LAB — LACTIC ACID, PLASMA
Lactic Acid, Venous: 1.1 mmol/L (ref 0.5–1.9)
Lactic Acid, Venous: 1.1 mmol/L (ref 0.5–1.9)

## 2021-10-20 MED ORDER — IOHEXOL 300 MG/ML  SOLN
100.0000 mL | Freq: Once | INTRAMUSCULAR | Status: AC | PRN
  Administered 2021-10-20: 100 mL via INTRAVENOUS

## 2021-10-20 MED ORDER — LIDOCAINE-EPINEPHRINE (PF) 2 %-1:200000 IJ SOLN
20.0000 mL | Freq: Once | INTRAMUSCULAR | Status: AC
  Administered 2021-10-20: 20 mL
  Filled 2021-10-20: qty 20

## 2021-10-20 MED ORDER — HYDROMORPHONE HCL 1 MG/ML IJ SOLN
1.0000 mg | Freq: Once | INTRAMUSCULAR | Status: AC
  Administered 2021-10-20: 1 mg via INTRAVENOUS
  Filled 2021-10-20: qty 1

## 2021-10-20 MED ORDER — NAPROXEN 375 MG PO TABS: 375.0000 mg | ORAL_TABLET | Freq: Two times a day (BID) | ORAL | 0 refills | Status: AC

## 2021-10-20 MED ORDER — HYDROCODONE-ACETAMINOPHEN 5-325 MG PO TABS: 1.0000 | ORAL_TABLET | Freq: Four times a day (QID) | ORAL | 0 refills | Status: DC | PRN

## 2021-10-20 NOTE — ED Provider Notes (Signed)
[facility]    CSN: [id number] Arrival date & time: 10/20/21  1059      History   Chief Complaint Chief Complaint  Patient presents with   Abscess    Left buttock    HPI Devanshi Califf is a 25 y.o. female.   Patient presents with 5-day history of abscess to left buttock.  Patient reports that it became inflamed approximately 2 days ago, and she is concerned that it is infected.  Patient has history of recurrent pilonidal abscess for multiple years where she is followed by general surgery.  Last saw general surgery in August where discussions for surgical procedure was discussed, but patient never had procedure performed.  Has been using warm compresses, bath soaks, ibuprofen with minimal improvement.  She also reports that she has had a fever of 102.   Abscess  Past Medical History:  Diagnosis Date   Headache(784.0)     Patient Active Problem List   Diagnosis Date Noted   Migraine without aura, without mention of intractable migraine without mention of status migrainosus 07/27/2014    History reviewed. No pertinent surgical history.  OB History   No obstetric history on file.      Home Medications    Prior to Admission medications   Medication Sig Start Date End Date Taking? Authorizing Provider  acetaminophen (TYLENOL) 500 MG tablet Take 500 mg by mouth every 6 (six) hours as needed.    [provider]  doxycycline (VIBRAMYCIN) 100 MG capsule Take 1 capsule (100 mg total) by mouth 2 (two) times daily. 05/15/21   Tommie Sams, DO  HYDROcodone-acetaminophen (NORCO/VICODIN) 5-325 MG tablet Take 1 tablet by mouth every 8 (eight) hours as needed for severe pain or moderate pain. 05/15/21   Tommie Sams, DO    Family History Family History  Problem Relation Age of Onset   Diabetes Other        Maternal side   High blood pressure Other        Maternal side    Arthritis Other        Maternal side     Social History Social History    Tobacco Use   Smoking status: Never   Smokeless tobacco: Never  Vaping Use   Vaping Use: Never used  Substance Use Topics   Alcohol use: No   Drug use: No     Allergies   Patient has no known allergies.   Review of Systems Review of Systems Per HPI  Physical Exam Triage Vital Signs ED Triage Vitals  Enc Vitals Group     BP 10/20/21 1327 (!) 143/82     Pulse Rate 10/20/21 1327 94     Resp 10/20/21 1327 18     Temp 10/20/21 1327 (!) 97.2 F (36.2 C)     Temp Source 10/20/21 1327 Oral     SpO2 10/20/21 1327 97 %     Weight --      Height --      Head Circumference --      Peak Flow --      Pain Score 10/20/21 1330 6     Pain Loc --      Pain Edu? --      Excl. in [place]? --    No data found.  Updated Vital Signs BP (!) 143/82 (BP Location: Left Arm)   Pulse 94   Temp (!) 97.2 F (36.2 C) (Oral)   Resp 18   LMP  (  LMP Unknown)   SpO2 97%   Visual Acuity Right Eye Distance:   Left Eye Distance:   Bilateral Distance:    Right Eye Near:   Left Eye Near:    Bilateral Near:     Physical Exam Constitutional:      General: She is not in acute distress.    Appearance: Normal appearance. She is not toxic-appearing or diaphoretic.  HENT:     Head: Normocephalic and atraumatic.  Eyes:     Extraocular Movements: Extraocular movements intact.     Conjunctiva/sclera: Conjunctivae normal.  Pulmonary:     Effort: Pulmonary effort is normal.  Skin:         Comments: Approximately 8 to 10 cm in diameter indurated abscess present to left upper buttocks that extends slightly into upper back.  No drainage noted.  Neurological:     General: No focal deficit present.     Mental Status: She is alert and oriented to person, place, and time. Mental status is at baseline.  Psychiatric:        Mood and Affect: Mood normal.        Behavior: Behavior normal.        Thought Content: Thought content normal.        Judgment: Judgment normal.     UC Treatments / Results   Labs (all labs ordered are listed, but only abnormal results are displayed) Labs Reviewed - No data to display  EKG   Radiology No results found.  Procedures Procedures (including critical care time)  Medications Ordered in UC Medications - No data to display  Initial Impression / Assessment and Plan / UC Course  I have reviewed the triage vital signs and the nursing notes.  Pertinent labs & imaging results that were available during my care of the patient were reviewed by me and considered in my medical decision making (see chart for details).     Pilonidal abscess seems diffuse and appears to be worsening.  It is also concerning that patient has a fever of 102 with no other causes of infection.  Unable to perform I&D in urgent care today given that abscess is indurated.  Patient will need to go to the hospital for further evaluation and management given setting of intermittent fevers and severity of abscess.  Patient advised to go to the hospital.  Patient agreeable with plan.  Vital signs stable at discharge.  Agree with patient self transport to the hospital. Final Clinical Impressions(s) / UC Diagnoses   Final diagnoses:  Pilonidal abscess  Fever, unspecified     Discharge Instructions      Please go to the emergency department as soon as you leave urgent care for further evaluation and management.    ED Prescriptions   None    PDMP not reviewed this encounter.   Gustavus Bryant, [place] 10/20/21 [phone]

## 2021-10-20 NOTE — ED Triage Notes (Signed)
Pt reports abscess on her tailbone area, began having fever, redness, swelling on Tuesday. Went to [facility]cc and sent here for further eval.

## 2021-10-20 NOTE — Discharge Instructions (Signed)
Please go to the emergency department as soon as you leave urgent care for further evaluation and management. ?

## 2021-10-20 NOTE — ED Provider Notes (Signed)
Emergency Medicine Provider Triage Evaluation Note  Erica Montes , a 25 y.o. female  was evaluated in triage.  Pt complains of abscess just above the gluteal cleft.  Worsening over the last 2 days.  History of pilonidal abscess and has seen general surgery who recommended removal but she never got done.  Intermittent fevers up to 102.  No drainage.  Review of Systems  Positive:  Negative: See above   Physical Exam  BP (!) 143/88 (BP Location: Left Arm)   Pulse 97   Temp 98.6 F (37 C) (Oral)   Resp 14   LMP 10/01/2021   SpO2 98%  Gen:   Awake, no distress   Resp:  Normal effort  MSK:   Moves extremities without difficulty  Other:  Sick centimeter area of induration and severe tenderness.  There is significant amount of surrounding erythema.  Medical Decision Making  Medically screening exam initiated at 3:18 PM.  Appropriate orders placed.  Zadie Deemer was informed that the remainder of the evaluation will be completed by another provider, this initial triage assessment does not replace that evaluation, and the importance of remaining in the ED until their evaluation is complete.     Honor Loh [place], PA-C 10/20/21 1519    Milagros Loll, MD 10/21/21 [phone]

## 2021-10-20 NOTE — ED Triage Notes (Signed)
Five day ago, Pt noticed a lesion on her left buttock. Two days ago, Pt reports the lesion looked to be getting infected. Confirms swelling. Has been using warm compresses, bath soaks and ibuprofen with some relief.

## 2021-10-20 NOTE — Discharge Instructions (Addendum)
Take the medications as needed for pain.  Continue warm soaks to help promote wound drainage.  Apply gauze and change the dressing daily.  Follow-up with a primary care doctor or general surgeon to be rechecked if not improving as expected

## 2021-10-20 NOTE — ED Provider Notes (Signed)
[facility] EMERGENCY DEPARTMENT Provider Note   CSN: [id number] Arrival date & time: 10/20/21  1448     History Chief Complaint  Patient presents with   Abscess    Erica Montes is a 25 y.o. female.   Abscess  Patient presented to the ED for evaluation of an abscess.  Patient states that she started to have some irritation to the proximal aspect of the gluteal cleft on Tuesday.  Initially was just sore.  However the last couple of days she has had increasing swelling and tenderness.  She has felt feverish.  She has a history of having a pilonidal abscess in the past that required incision and drainage.  This feels very similar.  Past Medical History:  Diagnosis Date   Headache(784.0)     Patient Active Problem List   Diagnosis Date Noted   Migraine without aura, without mention of intractable migraine without mention of status migrainosus 07/27/2014    History reviewed. No pertinent surgical history.   OB History   No obstetric history on file.     Family History  Problem Relation Age of Onset   Diabetes Other        Maternal side   High blood pressure Other        Maternal side    Arthritis Other        Maternal side     Social History   Tobacco Use   Smoking status: Never   Smokeless tobacco: Never  Vaping Use   Vaping Use: Never used  Substance Use Topics   Alcohol use: No   Drug use: No    Home Medications Prior to Admission medications   Medication Sig Start Date End Date Taking? Authorizing Provider  HYDROcodone-acetaminophen (NORCO/VICODIN) 5-325 MG tablet Take 1 tablet by mouth every 6 (six) hours as needed. 10/20/21  Yes Linwood Dibbles, MD  ibuprofen (ADVIL) 400 MG tablet Take 400 mg by mouth every 6 (six) hours as needed for moderate pain or headache.   Yes [provider]  naproxen (NAPROSYN) 375 MG tablet Take 1 tablet (375 mg total) by mouth 2 (two) times daily. 10/20/21  Yes Linwood Dibbles, MD  doxycycline  (VIBRAMYCIN) 100 MG capsule Take 1 capsule (100 mg total) by mouth 2 (two) times daily. Patient not taking: Reported on 10/20/2021 05/15/21   Tommie Sams, DO    Allergies    Patient has no known allergies.  Review of Systems   Review of Systems  All other systems reviewed and are negative.  Physical Exam Updated Vital Signs BP 132/76   Pulse (!) 105   Temp 98.6 F (37 C) (Oral)   Resp 17   LMP 10/01/2021   SpO2 98%   Physical Exam Vitals and nursing note reviewed.  Constitutional:      General: She is not in acute distress.    Appearance: She is well-developed.  HENT:     Head: Normocephalic and atraumatic.     Right Ear: External ear normal.     Left Ear: External ear normal.  Eyes:     General: No scleral icterus.       Right eye: No discharge.        Left eye: No discharge.     Conjunctiva/sclera: Conjunctivae normal.  Neck:     Trachea: No tracheal deviation.  Cardiovascular:     Rate and Rhythm: Normal rate.  Pulmonary:     Effort: Pulmonary effort is normal. No respiratory  distress.     Breath sounds: No stridor.  Abdominal:     General: There is no distension.  Genitourinary:    Comments: Large pilonidal abscess at the proximal aspect of the gluteal cleft,, tenderness to palpation, fluctuant Musculoskeletal:        General: No swelling or deformity.     Cervical back: Neck supple.  Skin:    General: Skin is warm and dry.     Findings: No rash.  Neurological:     Mental Status: She is alert.     Cranial Nerves: Cranial nerve deficit: no gross deficits.    ED Results / Procedures / Treatments   Labs (all labs ordered are listed, but only abnormal results are displayed) Labs Reviewed  COMPREHENSIVE METABOLIC PANEL - Abnormal; Notable for the following components:      Result Value   Sodium 134 (*)    Glucose, Bld 135 (*)    Albumin 3.1 (*)    All other components within normal limits  CBC WITH DIFFERENTIAL/PLATELET - Abnormal; Notable for the  following components:   WBC 13.4 (*)    HCT 35.1 (*)    MCV 79.4 (*)    Neutro Abs 10.4 (*)    Monocytes Absolute 1.2 (*)    All other components within normal limits  LACTIC ACID, PLASMA  LACTIC ACID, PLASMA  I-STAT BETA HCG BLOOD, ED ([facility], [facility], [facility] ONLY)    EKG None  Radiology CT PELVIS W CONTRAST  Result Date: 10/20/2021 CLINICAL DATA:  Anorectal abscess EXAM: CT PELVIS WITH CONTRAST TECHNIQUE: Multidetector CT imaging of the pelvis was performed using the standard protocol following the bolus administration of intravenous contrast. CONTRAST:  OMNIPAQUE IOHEXOL 300 MG/ML  SOLN COMPARISON:  None. FINDINGS: Urinary Tract:  No abnormality visualized. Bowel: Moderate stool within the rectal vault. The visualized bowel is otherwise unremarkable. No free intraperitoneal fluid. Vascular/Lymphatic: No pathologically enlarged lymph nodes. No significant vascular abnormality seen. Reproductive:  No mass or other significant abnormality Other: There is a rim enhancing inflammatory collection immediately superior to the gluteal cleft measuring 5.2 x 6.1 x 5.6 cm most in keeping with an inflamed pilonidal cyst/abscess. This communicates with the sacrococcygeal junction inferiorly, best seen at axial image # 229/11 and sagittal image # 88/10. There is moderate surrounding subcutaneous edema within the lumbar soft tissues. Musculoskeletal: No acute bone abnormality. No lytic or blastic bone lesion. IMPRESSION: 6.1 cm inflamed loculated subcutaneous fluid collection immediately superior to the gluteal cleft most in keeping with a inflamed pilonidal cyst/abscess. Electronically Signed   By: Helyn Numbers M.D.   On: 10/20/2021 19:36    Procedures .Marland KitchenIncision and Drainage  Date/Time: 10/20/2021 9:41 PM Performed by: Linwood Dibbles, MD Authorized by: Linwood Dibbles, MD   Consent:    Consent obtained:  Verbal   Consent given by:  Patient   Risks discussed:  Bleeding, incomplete drainage, pain and damage to  other organs   Alternatives discussed:  No treatment Universal protocol:    Procedure explained and questions answered to patient or proxy's satisfaction: yes     Relevant documents present and verified: yes     Test results available : yes     Imaging studies available: yes     Required blood products, implants, devices, and special equipment available: yes     Site/side marked: yes     Immediately prior to procedure, a time out was called: yes     Patient identity confirmed:  Verbally with patient Location:  Type:  Abscess   Location: pilonidal. Pre-procedure details:    Skin preparation:  Chlorhexidine Sedation:    Sedation type:  None Anesthesia:    Anesthesia method:  Local infiltration   Local anesthetic:  Lidocaine 1% WITH epi Procedure type:    Complexity:  Complex Procedure details:    Ultrasound guidance: no     Needle aspiration: no     Incision types:  Single straight   Incision depth:  Subcutaneous   Wound management:  Probed and deloculated, irrigated with saline and extensive cleaning   Drainage:  Purulent   Drainage amount:  Moderate   Wound treatment:  Wound left open   Packing materials:  None Post-procedure details:    Procedure completion:  Tolerated well, no immediate complications   Medications Ordered in ED Medications  iohexol (OMNIPAQUE) 300 MG/ML solution 100 mL (100 mLs Intravenous Contrast Given 10/20/21 1904)  lidocaine-EPINEPHrine (XYLOCAINE W/EPI) 2 %-1:200000 (PF) injection 20 mL (20 mLs Infiltration Given 10/20/21 2106)  HYDROmorphone (DILAUDID) injection 1 mg (1 mg Intravenous Given 10/20/21 2049)    ED Course  I have reviewed the triage vital signs and the nursing notes.  Pertinent labs & imaging results that were available during my care of the patient were reviewed by me and considered in my medical decision making (see chart for details).  Clinical Course as of 10/20/21 2145  Thu Oct 20, 2021  2039 Labs reviewed.  Leukocytosis  noted.  No electrolyte abnormalities.  No signs of lactic acidosis. [JK]  2039 CT scan findings reviewed which does show pilonidal abscess [JK]    Clinical Course User Index [JK] Linwood Dibbles, MD   MDM Rules/Calculators/A&P                           Patient presented with complaints of an abscess in the pilonidal region.  Laboratory tests and CT scan confirmed these findings.  No signs of systemic infection.  I&D procedure performed and a copious amount of purulent material was drained.  Wound was also copiously irrigated with normal saline.  Will leave wound open. Final Clinical Impression(s) / ED Diagnoses Final diagnoses:  Pilonidal abscess    Rx / DC Orders ED Discharge Orders          Ordered    HYDROcodone-acetaminophen (NORCO/VICODIN) 5-325 MG tablet  Every 6 hours PRN        10/20/21 2144    naproxen (NAPROSYN) 375 MG tablet  2 times daily        10/20/21 2144             Linwood Dibbles, MD 10/20/21 2145

## 2021-12-31 ENCOUNTER — Encounter (HOSPITAL_COMMUNITY): Payer: Self-pay

## 2021-12-31 ENCOUNTER — Emergency Department (HOSPITAL_COMMUNITY)
Admission: EM | Admit: 2021-12-31 | Discharge: 2021-12-31 | Disposition: A | Discharge status: Home / Self Care | Payer: BC Managed Care – PPO | Attending: Emergency Medicine | Admitting: Emergency Medicine

## 2021-12-31 ENCOUNTER — Other Ambulatory Visit: Payer: Self-pay

## 2021-12-31 DIAGNOSIS — L0501 Pilonidal cyst with abscess: Secondary | ICD-10-CM

## 2021-12-31 MED ORDER — DOXYCYCLINE HYCLATE 100 MG PO CAPS: 100.0000 mg | ORAL_CAPSULE | Freq: Two times a day (BID) | ORAL | 0 refills | Status: DC

## 2021-12-31 MED ORDER — OXYCODONE-ACETAMINOPHEN 5-325 MG PO TABS: 1.0000 | ORAL_TABLET | Freq: Four times a day (QID) | ORAL | 0 refills | Status: AC | PRN

## 2021-12-31 MED ORDER — LIDOCAINE-EPINEPHRINE (PF) 2 %-1:200000 IJ SOLN
10.0000 mL | Freq: Once | INTRAMUSCULAR | Status: AC
  Administered 2021-12-31: 10 mL
  Filled 2021-12-31: qty 20

## 2021-12-31 MED ORDER — HYDROCODONE-ACETAMINOPHEN 5-325 MG PO TABS: 1.0000 | ORAL_TABLET | Freq: Four times a day (QID) | ORAL | 0 refills | Status: DC | PRN

## 2021-12-31 NOTE — ED Provider Notes (Signed)
[facility] EMERGENCY DEPARTMENT Provider Note   CSN: [id number] Arrival date & time: 12/31/21  1224     History  No chief complaint on file.   Erica Montes is a 26 y.o. female.  26 year old female with prior medical history as detailed below presents for evaluation.  Patient complains of pilonidal abscess.  Patient reports that she has had multiple drainages of similar in the past.  Reports onset of pain and swelling to the area over the last 3 to 4 days.  She denies fever.  The history is provided by the patient and medical records.  Illness Location:  Pilonidal abscess Severity:  Moderate Onset quality:  Gradual Duration:  4 days Timing:  Constant Progression:  Worsening Chronicity:  Recurrent     Home Medications Prior to Admission medications   Medication Sig Start Date End Date Taking? Authorizing Provider  doxycycline (VIBRAMYCIN) 100 MG capsule Take 1 capsule (100 mg total) by mouth 2 (two) times daily. 12/31/21  Yes Wynetta Fines, MD  HYDROcodone-acetaminophen (NORCO/VICODIN) 5-325 MG tablet Take 1 tablet by mouth every 6 (six) hours as needed. 12/31/21  Yes Wynetta Fines, MD  doxycycline (VIBRAMYCIN) 100 MG capsule Take 1 capsule (100 mg total) by mouth 2 (two) times daily. Patient not taking: Reported on 10/20/2021 05/15/21   Tommie Sams, DO  HYDROcodone-acetaminophen (NORCO/VICODIN) 5-325 MG tablet Take 1 tablet by mouth every 6 (six) hours as needed. 10/20/21   Linwood Dibbles, MD  ibuprofen (ADVIL) 400 MG tablet Take 400 mg by mouth every 6 (six) hours as needed for moderate pain or headache.    [provider]  naproxen (NAPROSYN) 375 MG tablet Take 1 tablet (375 mg total) by mouth 2 (two) times daily. 10/20/21   Linwood Dibbles, MD      Allergies    Patient has no known allergies.    Review of Systems   Review of Systems  All other systems reviewed and are negative.  Physical Exam Updated Vital Signs BP 140/78    Pulse  100    Temp 98.9 F (37.2 C) (Oral)    Resp 18    SpO2 99%  Physical Exam Vitals and nursing note reviewed.  Constitutional:      General: She is not in acute distress.    Appearance: Normal appearance. She is well-developed.  HENT:     Head: Normocephalic and atraumatic.  Eyes:     Conjunctiva/sclera: Conjunctivae normal.     Pupils: Pupils are equal, round, and reactive to light.  Cardiovascular:     Rate and Rhythm: Normal rate and regular rhythm.     Heart sounds: Normal heart sounds.  Pulmonary:     Effort: Pulmonary effort is normal. No respiratory distress.     Breath sounds: Normal breath sounds.  Abdominal:     General: There is no distension.     Palpations: Abdomen is soft.     Tenderness: There is no abdominal tenderness.  Musculoskeletal:        General: No deformity. Normal range of motion.     Cervical back: Normal range of motion and neck supple.  Skin:    General: Skin is warm and dry.     Comments: Pilonidal abscess present on the patient.  Significant fluctuance and swelling at the superior aspect of the gluteal cleft noted.  Neurological:     General: No focal deficit present.     Mental Status: She is alert and oriented to person,  place, and time.    ED Results / Procedures / Treatments   Labs (all labs ordered are listed, but only abnormal results are displayed) Labs Reviewed - No data to display  EKG None  Radiology No results found.  Procedures .Marland KitchenIncision and Drainage  Date/Time: 12/31/2021 3:59 PM Performed by: Wynetta Fines, MD Authorized by: Wynetta Fines, MD   Consent:    Consent obtained:  Verbal   Risks, benefits, and alternatives were discussed: yes     Risks discussed:  Bleeding, damage to other organs, incomplete drainage, infection and pain   Alternatives discussed:  No treatment Universal protocol:    Immediately prior to procedure, a time out was called: yes     Patient identity confirmed:  Verbally with  patient Location:    Type:  Abscess   Location:  Trunk (Pilonidal abscess) Pre-procedure details:    Skin preparation:  Antiseptic wash Sedation:    Sedation type:  None Anesthesia:    Anesthesia method:  Local infiltration   Local anesthetic:  Lidocaine 2% WITH epi Procedure type:    Complexity:  Simple Procedure details:    Needle aspiration: yes     Needle size:  18 G   Incision types:  Stab incision   Incision depth:  Subcutaneous   Wound management:  Probed and deloculated   Drainage:  Purulent   Drainage amount:  Moderate   Wound treatment:  Drain placed   Packing materials:  1/4 in iodoform gauze Post-procedure details:    Procedure completion:  Tolerated    Medications Ordered in ED Medications  lidocaine-EPINEPHrine (XYLOCAINE W/EPI) 2 %-1:200000 (PF) injection 10 mL (10 mLs Infiltration Given 12/31/21 1317)    ED Course/ Medical Decision Making/ A&P                           Medical Decision Making Risk Prescription drug management.    Medical Screen Complete  This patient presented to the ED with complaint of pilonidal abscess.  This complaint involves an extensive number of treatment options. The initial differential diagnosis includes, but is not limited to, abscess  This presentation is: Acute  Patient with prior history of pilonidal abscess presents with recurrent abscess.  I&D performed without difficulty here in the ED  Patient tolerated well  Patient understands plan of care  Patient has already established care with [facility] surgery.  She is advised to closely follow-up with them for continued wound care and also definitive treatment.  Co morbidities that complicated the patient's evaluation  History of recurrent pilonidal abscess   Additional history obtained:  Additional history obtained from Spouse External records from outside sources obtained and reviewed including prior ED visits and prior Inpatient records.    Problem List / ED Course:  Pilonidal abscess requiring I&D   Reevaluation:  After the interventions noted above, I reevaluated the patient and found that they have: improved  Disposition:  After consideration of the diagnostic results and the patients response to treatment, I feel that the patent would benefit from close outpatient follow-up.          Final Clinical Impression(s) / ED Diagnoses Final diagnoses:  Pilonidal abscess    Rx / DC Orders ED Discharge Orders          Ordered    HYDROcodone-acetaminophen (NORCO/VICODIN) 5-325 MG tablet  Every 6 hours PRN        12/31/21 1453    doxycycline (VIBRAMYCIN) 100  MG capsule  2 times daily        12/31/21 1453              Wynetta Fines, MD 12/31/21 [phone]

## 2021-12-31 NOTE — ED Triage Notes (Signed)
Patient complains of abscess to sacrum x 2 days, denies drainage. Has hx of same that required drainage in ED in past. Denies fever, denies chills

## 2021-12-31 NOTE — ED Provider Triage Note (Signed)
Emergency Medicine Provider Triage Evaluation Note  Erica Montes , a 26 y.o. female  was evaluated in triage.  Pt complains of abscess on tailbone. Hx of pilonidal cysts requiring drainage in the ED. Said she felt feverish several days ago, but no documented temperature. No change in bowel habits.   Review of Systems  As above  Physical Exam  BP (!) 147/99 (BP Location: Right Arm)    Pulse (!) 134    Temp 99.1 F (37.3 C) (Oral)    Resp 18    SpO2 99%  Gen:   Awake, no distress   Resp:  Normal effort  MSK:   Moves extremities without difficulty  Other:  Pt sitting on side avoiding sitting on backside due to pain  Medical Decision Making  Medically screening exam initiated at 12:38 PM.  Appropriate orders placed.  Erica Montes was informed that the remainder of the evaluation will be completed by another provider, this initial triage assessment does not replace that evaluation, and the importance of remaining in the ED until their evaluation is complete.     Erica Montes T, PA-C 12/31/21 1239

## 2022-05-25 ENCOUNTER — Encounter (HOSPITAL_COMMUNITY): Payer: Self-pay | Admitting: Emergency Medicine

## 2022-05-25 ENCOUNTER — Emergency Department (HOSPITAL_COMMUNITY)
Admission: EM | Admit: 2022-05-25 | Discharge: 2022-05-25 | Disposition: A | Discharge status: Home / Self Care | Payer: Medicaid Other | Attending: Emergency Medicine | Admitting: Emergency Medicine

## 2022-05-25 DIAGNOSIS — L0501 Pilonidal cyst with abscess: Secondary | ICD-10-CM

## 2022-05-25 MED ORDER — HYDROCODONE-ACETAMINOPHEN 5-325 MG PO TABS: 2.0000 | ORAL_TABLET | ORAL | 0 refills | Status: AC | PRN

## 2022-05-25 MED ORDER — DOXYCYCLINE HYCLATE 100 MG PO CAPS: 100.0000 mg | ORAL_CAPSULE | Freq: Two times a day (BID) | ORAL | 0 refills | Status: AC

## 2022-05-25 MED ORDER — OXYCODONE HCL 5 MG PO TABS
10.0000 mg | ORAL_TABLET | Freq: Once | ORAL | Status: AC
  Administered 2022-05-25: 10 mg via ORAL
  Filled 2022-05-25: qty 2

## 2022-05-25 MED ORDER — LIDOCAINE-EPINEPHRINE 2 %-1:100000 IJ SOLN: 20.0000 mL | Freq: Once | INTRAMUSCULAR | Status: DC

## 2022-05-25 MED ORDER — LIDOCAINE-EPINEPHRINE (PF) 2 %-1:200000 IJ SOLN
20.0000 mL | Freq: Once | INTRAMUSCULAR | Status: AC
  Administered 2022-05-25: 20 mL via INTRADERMAL
  Filled 2022-05-25: qty 20

## 2022-05-25 NOTE — Discharge Instructions (Addendum)
You were seen in the emergency department for evaluation of a pilonidal abscess.  The abscess was drained at bedside.  You will be prescribed doxycycline 100 mg to take twice a day for the next 10 days.  You are also given a short prescription for Norco.  Please follow-up with the primary care provider listed above and/or follow-up with your own primary care provider now that you have health insurance.  Please continue symptomatic management at home with ibuprofen and Tylenol as well as keeping the area dry.  Please also follow-up with a primary care provider for removal of the bandage material in the coming days and/or take it out on your own in the coming days.

## 2022-05-25 NOTE — ED Triage Notes (Signed)
Patient here with complaint of recurrent pilonidal abscess, most recently drained eight months ago, started bothering her again yesterday.

## 2022-05-25 NOTE — ED Provider Notes (Signed)
[facility] EMERGENCY DEPARTMENT Provider Note   CSN: [id number] Arrival date & time: 05/25/22  0745     History  Chief Complaint  Patient presents with   Abscess    Erica Montes is a 26 y.o. female.  kommer   Abscess  Patient is a 26 year old female with a history of pilonidal cyst who presents emergency department for evaluation of a pilonidal cyst.  According to the patient since she injured her coccyx back in 2015 she has continually had issues with abscess development in her pilonidal area.  She was most recently seen on 12/31/2021 in the setting of a pilonidal cyst.  She was prescribed doxycycline at that time.  Patient now reports that since yesterday her area of prior pilonidal cyst has started to bother her again.  She has not had any fevers or chills at home.  The pain around her sinuses area has been progressively worsening.  She is taken ibuprofen at home but denies taking any other medications for her symptoms.  She had does have some surrounding redness to the area.  Her last bowel movement was this past Monday which has not abnormal for her.  She denies any urinary frequency/urgency or dysuria.  She is currently on her menstrual cycle.  She denies any current abdominal pain, chest pain, shortness of breath.       Home Medications Prior to Admission medications   Medication Sig Start Date End Date Taking? Authorizing Provider  doxycycline (VIBRAMYCIN) 100 MG capsule Take 1 capsule (100 mg total) by mouth 2 (two) times daily. 05/25/22  Yes Ryen Rhames, Onalee Hua, MD  HYDROcodone-acetaminophen (NORCO/VICODIN) 5-325 MG tablet Take 2 tablets by mouth every 4 (four) hours as needed. 05/25/22  Yes Chandrika Sandles, Onalee Hua, MD  ibuprofen (ADVIL) 400 MG tablet Take 400 mg by mouth every 6 (six) hours as needed for moderate pain or headache.    [provider]  naproxen (NAPROSYN) 375 MG tablet Take 1 tablet (375 mg total) by mouth 2 (two) times daily.  10/20/21   Linwood Dibbles, MD  oxyCODONE-acetaminophen (PERCOCET/ROXICET) 5-325 MG tablet Take 1 tablet by mouth every 6 (six) hours as needed for severe pain. 12/31/21   Long, Arlyss Repress, MD      Allergies    Patient has no known allergies.    Review of Systems   Review of Systems  Physical Exam Updated Vital Signs BP (!) 119/53   Pulse 99   Temp 98.3 F (36.8 C) (Oral)   Resp 18   SpO2 98%  Physical Exam Vitals and nursing note reviewed.  Constitutional:      General: She is not in acute distress.    Appearance: She is well-developed.  HENT:     Right Ear: External ear normal.     Left Ear: External ear normal.     Nose: Nose normal.     Mouth/Throat:     Mouth: Mucous membranes are moist.     Pharynx: Oropharynx is clear.  Eyes:     Extraocular Movements: Extraocular movements intact.     Conjunctiva/sclera: Conjunctivae normal.     Pupils: Pupils are equal, round, and reactive to light.  Cardiovascular:     Rate and Rhythm: Normal rate and regular rhythm.     Heart sounds: No murmur heard. Pulmonary:     Effort: Pulmonary effort is normal. No respiratory distress.     Breath sounds: Normal breath sounds. No wheezing, rhonchi or rales.  Abdominal:  Palpations: Abdomen is soft.     Tenderness: There is no abdominal tenderness. There is no right CVA tenderness, left CVA tenderness, guarding or rebound.  Musculoskeletal:        General: No swelling.     Cervical back: Neck supple.  Skin:    General: Skin is warm and dry.     Capillary Refill: Capillary refill takes less than 2 seconds.     Comments: 5 x 5 area of erythema near the area of the coccyx with 2 x 2 area of induration and previous scar tissue noted  Neurological:     Mental Status: She is alert.  Psychiatric:        Mood and Affect: Mood normal.     ED Results / Procedures / Treatments   Labs (all labs ordered are listed, but only abnormal results are displayed) Labs Reviewed - No data to  display  EKG None  Radiology No results found.  Procedures .Marland KitchenIncision and Drainage  Date/Time: 05/25/2022 5:07 PM  Performed by: Nelta Numbers, MD Authorized by: Teressa Lower, MD   Consent:    Consent obtained:  Verbal   Consent given by:  Patient   Risks discussed:  Bleeding, incomplete drainage, infection and pain   Alternatives discussed:  No treatment, delayed treatment and alternative treatment Universal protocol:    Patient identity confirmed:  Verbally with patient and arm band Location:    Type:  Pilonidal cyst   Size:  5x5   Location:  Anogenital   Anogenital location:  Pilonidal Pre-procedure details:    Skin preparation:  Chlorhexidine Sedation:    Sedation type:  Anxiolysis Anesthesia:    Anesthesia method:  Local infiltration   Local anesthetic:  Lidocaine 2% WITH epi Procedure type:    Complexity:  Simple Procedure details:    Incision types:  Stab incision, elliptical and single with marsupialization   Incision depth:  Dermal   Wound management:  Probed and deloculated   Drainage:  Purulent and bloody   Drainage amount:  Moderate   Wound treatment:  Wound left open   Packing materials:  1/4 in iodoform gauze   Amount 1/4" iodoform:  5 inches Post-procedure details:    Procedure completion:  Tolerated well, no immediate complications     Medications Ordered in ED Medications  oxyCODONE (Oxy IR/ROXICODONE) immediate release tablet 10 mg (10 mg Oral Given 05/25/22 1617)  lidocaine-EPINEPHrine (XYLOCAINE W/EPI) 2 %-1:200000 (PF) injection 20 mL (20 mLs Intradermal Given by Other 05/25/22 1618)    ED Course/ Medical Decision Making/ A&P                           Medical Decision Making Problems Addressed: Pilonidal abscess: complicated acute illness or injury with systemic symptoms  Amount and/or Complexity of Data Reviewed Independent Historian: spouse External Data Reviewed: labs and notes.  Risk Prescription drug  management.   Patient is a 26 year old female who presents emergency department as above.  On initial presentation patient's vital signs are stable and she is afebrile.  Physical exam revealed a 5 x 5 cm area of erythema near her pilonidal area with some scar tissue.  No fluctuance consistent with pilonidal abscess/cyst.  Per patient's chart review she has been evaluated multiple times in the past the setting of a pilonidal cyst.  She denies any systemic symptoms such as fevers, chills, headache.  Incision and drainage was performed at bedside.  Patient was given 1 dose of  oxycodone prior to the initiation of the procedure and local anesthetic was provided.  Moderate amount of drainage was able to be expressed from the pilonidal cyst.  Patient expressed feeling better.  She was given a prescription for doxycycline to take for the next 10 days twice a day.  She was also given a short course of Norco as well and given [facility]-up with [facility].  She states that she had previously been evaluated by this ", however due to her turning 26 and losing her insurance she was not able to be seen. However the patient has now have no insurance.  Plan findings discussed with patient at bedside who verbalized understanding was agreeable.  Patient discharged in the ED in stable condition.        Final Clinical Impression(s) / ED Diagnoses Final diagnoses:  Pilonidal abscess    Rx / DC Orders ED Discharge Orders          Ordered    doxycycline (VIBRAMYCIN) 100 MG capsule  2 times daily        05/25/22 1709    HYDROcodone-acetaminophen (NORCO/VICODIN) 5-325 MG tablet  Every 4 hours PRN        05/25/22 1709              Tommie Raymond, MD 05/25/22 1745    Glendora Score, MD 05/26/22 1146

## 2022-05-25 NOTE — ED Notes (Signed)
Pt A&OX4 ambulatory at d/c with independent steady gait, NAD. Pt verbalized understanding of d/c instructions, prescriptions and follow up care. 

## 2022-12-15 ENCOUNTER — Other Ambulatory Visit: Payer: Self-pay

## 2022-12-15 ENCOUNTER — Emergency Department (HOSPITAL_COMMUNITY)
Admission: EM | Admit: 2022-12-15 | Discharge: 2022-12-15 | Disposition: A | Discharge status: Home / Self Care | Payer: BC Managed Care – PPO | Attending: Emergency Medicine | Admitting: Emergency Medicine

## 2022-12-15 ENCOUNTER — Encounter (HOSPITAL_COMMUNITY): Payer: Self-pay | Admitting: *Deleted

## 2022-12-15 DIAGNOSIS — L0501 Pilonidal cyst with abscess: Secondary | ICD-10-CM | POA: Diagnosis present

## 2022-12-15 MED ORDER — IBUPROFEN 600 MG PO TABS: 600.0000 mg | ORAL_TABLET | Freq: Four times a day (QID) | ORAL | 0 refills | Status: AC | PRN

## 2022-12-15 MED ORDER — ACETAMINOPHEN 500 MG PO TABS: 500.0000 mg | ORAL_TABLET | Freq: Four times a day (QID) | ORAL | 0 refills | Status: AC | PRN

## 2022-12-15 NOTE — ED Triage Notes (Signed)
BIB family by POV from home for pilonidal cyst, h/o same ~ Q6 months, denies other sx, reports has drained. Rates pain 7/10.

## 2022-12-15 NOTE — ED Notes (Signed)
Pt d/c with instructions. Verbalized understanding. NAD. Stable condition. F/U discussed.

## 2022-12-15 NOTE — Discharge Instructions (Addendum)
You came to the emergency department for your pilonidal cyst.  There is no fluid pocket for [place] to drain today.  I would like you to call the general surgery office for an appointment.  The 2 [place] offices are good for primary care.  You may also call any other PCP in the area.  It was a pleasure to meet you and we hope you feel better!

## 2022-12-15 NOTE — ED Provider Notes (Signed)
[place] EMERGENCY DEPARTMENT AT [facility] Provider Note   CSN: [id number] Arrival date & time: 12/15/22  0732     History  Chief Complaint  Patient presents with   Cyst    Erica Montes is a 27 y.o. female with a pmh of recurrent pilonidal cysts.  She reports that this has been going on for nearly a week.  3 days ago she noted some drainage after multiple sitz bath's and compresses.  Today she says that the pain is very severe and refractory to ibuprofen.  No fevers or chills.  HPI     Home Medications Prior to Admission medications   Medication Sig Start Date End Date Taking? Authorizing Provider  doxycycline (VIBRAMYCIN) 100 MG capsule Take 1 capsule (100 mg total) by mouth 2 (two) times daily. 05/25/22   Nelta Numbers, MD  HYDROcodone-acetaminophen (NORCO/VICODIN) 5-325 MG tablet Take 2 tablets by mouth every 4 (four) hours as needed. 05/25/22   Nelta Numbers, MD  ibuprofen (ADVIL) 400 MG tablet Take 400 mg by mouth every 6 (six) hours as needed for moderate pain or headache.    [provider]  naproxen (NAPROSYN) 375 MG tablet Take 1 tablet (375 mg total) by mouth 2 (two) times daily. 10/20/21   Dorie Rank, MD  oxyCODONE-acetaminophen (PERCOCET/ROXICET) 5-325 MG tablet Take 1 tablet by mouth every 6 (six) hours as needed for severe pain. 12/31/21   Long, Wonda Olds, MD      Allergies    Patient has no known allergies.    Review of Systems   Review of Systems  Physical Exam Updated Vital Signs BP 137/76 (BP Location: Right Arm)   Pulse 98   Temp 98.3 F (36.8 C) (Oral)   Resp 16   Wt 74.8 kg   LMP 11/28/2022   SpO2 100%   BMI 28.32 kg/m  Physical Exam Vitals and nursing note reviewed.  Constitutional:      Appearance: Normal appearance.  HENT:     Head: Normocephalic and atraumatic.  Eyes:     General: No scleral icterus.    Conjunctiva/sclera: Conjunctivae normal.  Pulmonary:     Effort: Pulmonary effort is normal. No  respiratory distress.  Skin:    Findings: No rash.     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  Also has some induration just lateral to this area.  No obvious cyst or abscess noted to the midline gluteal cleft.  TTP around the induration and keloid scar  Neurological:     Mental Status: She is alert.  Psychiatric:        Mood and Affect: Mood normal.     ED Results / Procedures / Treatments   Labs (all labs ordered are listed, but only abnormal results are displayed) Labs Reviewed - No data to display  EKG None  Radiology No results found.  Procedures Procedures   Medications Ordered in ED Medications - No data to display  ED Course/ Medical Decision Making/ A&P                             Medical Decision Making Risk OTC drugs. Prescription drug management.    27 year old female presenting today with concern for pilonidal cyst.  Reports that it drained a few days ago but she is still having pain refractory to ibuprofen.  Physical exam largely benign.  No cyst palpated or noted to the gluteal cleft.  She does have a keloid scar to the right buttocks, likely secondary to previous drainage.  Additionally she has an area of induration with some skin peeling to the right buttocks as well where she says that the cyst drained.  There was nothing requiring incision and drainage today.  It likely was worse prior to drainage a few days ago.  We discussed the importance of her following up with general surgery.  Per chart review she had seen them in 2022 however she has not followed up.  She is agreeable to the plan.  Extra strength Tylenol and ibuprofen were sent to her pharmacy for her to alternate.  All questions were answered and she was discharged in stable condition.    Final Clinical Impression(s) / ED Diagnoses Final diagnoses:  Pilonidal abscess    Rx / DC Orders ED Discharge Orders          Ordered    ibuprofen (ADVIL) 600 MG tablet  Every  6 hours PRN        12/15/22 0807    acetaminophen (TYLENOL) 500 MG tablet  Every 6 hours PRN        12/15/22 4259           Results and diagnoses were explained to the patient. Return precautions discussed in full. Patient had no additional questions and expressed complete understanding.   This chart was dictated using voice recognition software.  Despite best efforts to proofread,  errors can occur which can change the documentation meaning.     Rhae Hammock, PA-C 12/15/22 [place], [place], DO 12/15/22 [phone]

## 2023-02-04 IMAGING — CT CT PELVIS W/ CM
2 of 4 series · 14 of 46 positions shown, 16 images · IV contrast (omnipaque)
Comparison: None.

CLINICAL DATA: Anorectal abscess

EXAM:
CT PELVIS WITH CONTRAST
TECHNIQUE: Multidetector CT imaging of the pelvis was performed using the
standard protocol following the bolus administration of intravenous
contrast.
CONTRAST:  100mL OMNIPAQUE IOHEXOL 300 MG/ML  SOLN

[Series 8: coronal st · coronal · 0.61mm/px · 3 of 203 slices shown]
[im 68/203  soft-tissue]
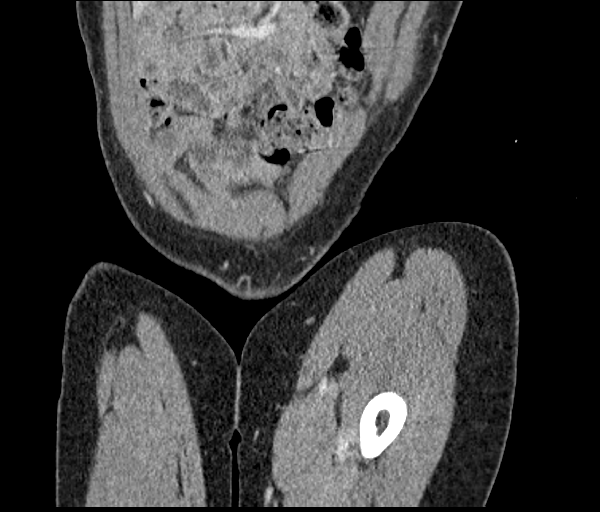
[im 90/203  soft-tissue]
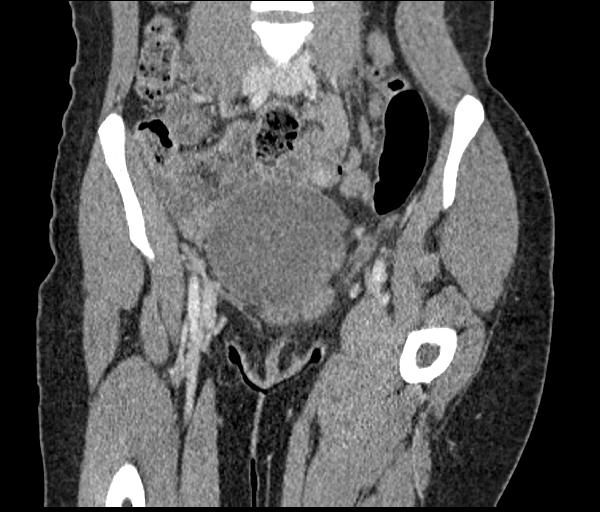
[im 113/203  soft-tissue]
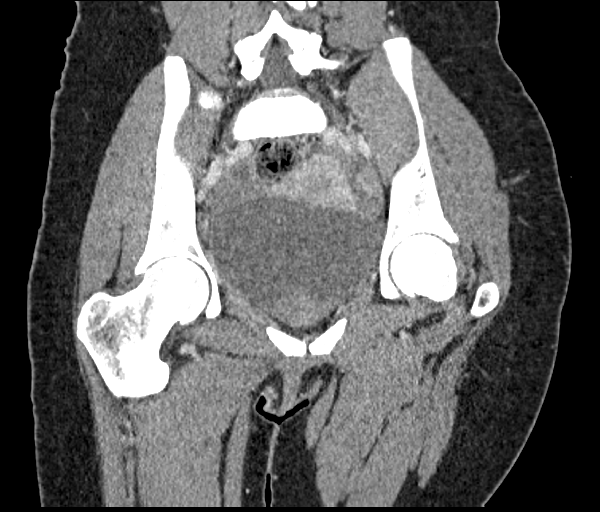

[Series 11: pelvis thin · axial · 0.67mm/px · z∈[+729,+987]mm · 11 of 518 slices shown, 13 images]
[im 44/518  soft-tissue]
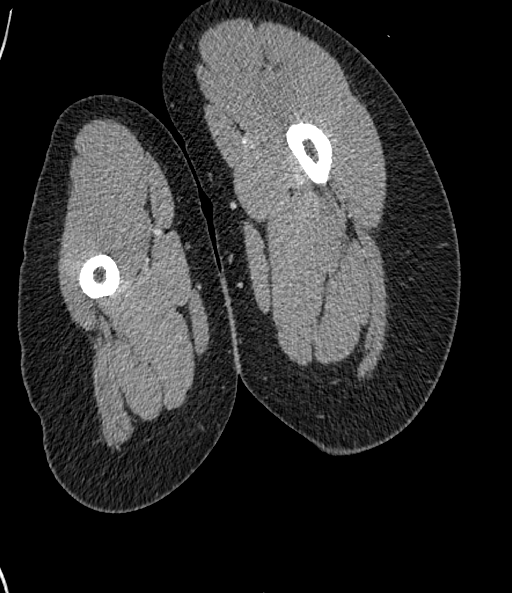
[im 44/518  bone]
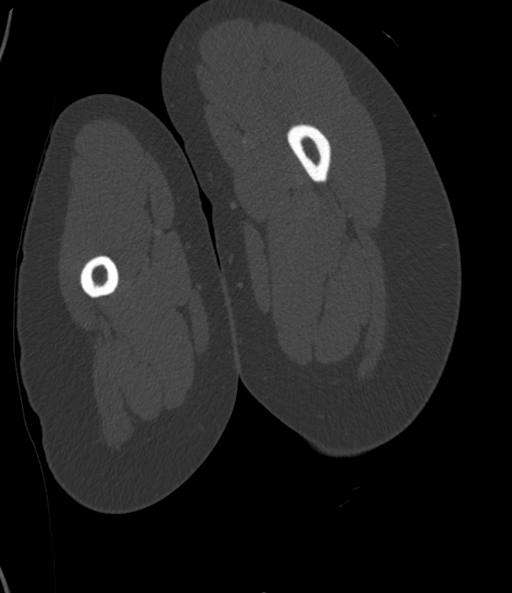
[im 87/518  soft-tissue]
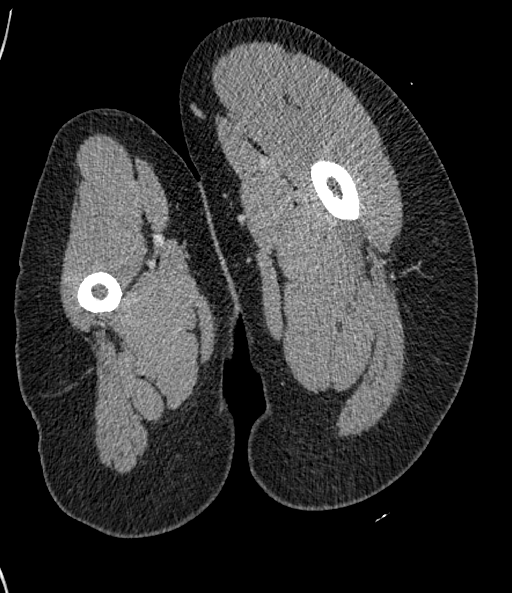
[im 130/518  soft-tissue]
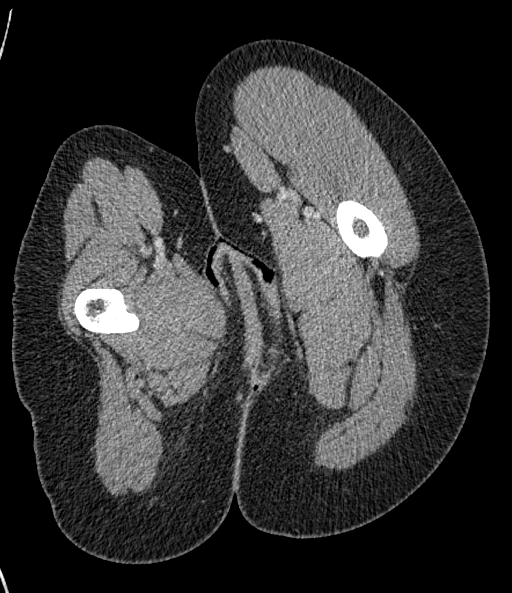
[im 173/518  soft-tissue]
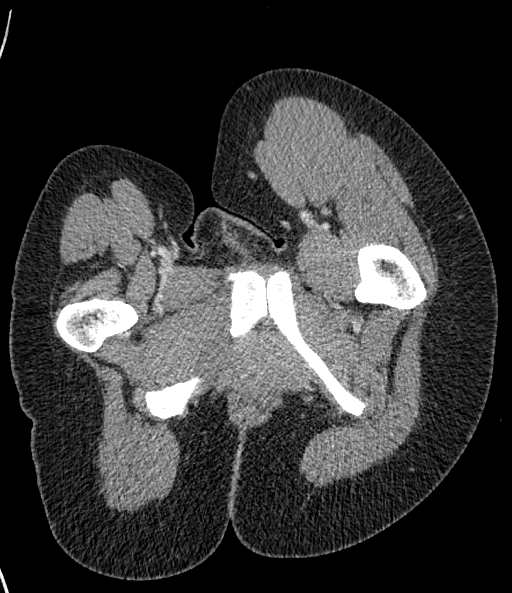
[im 216/518  soft-tissue]
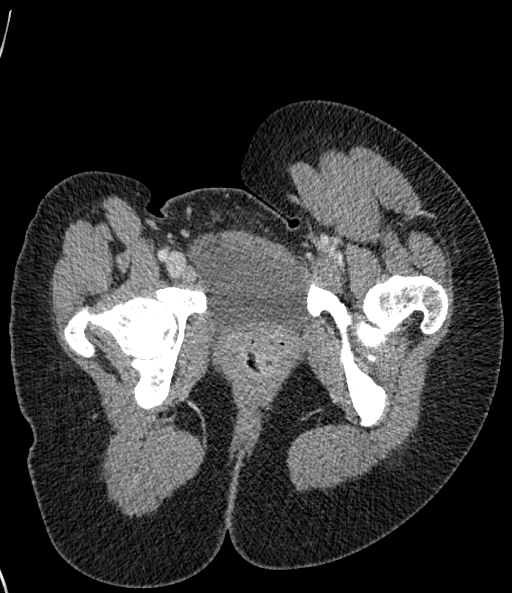
[im 259/518  soft-tissue]
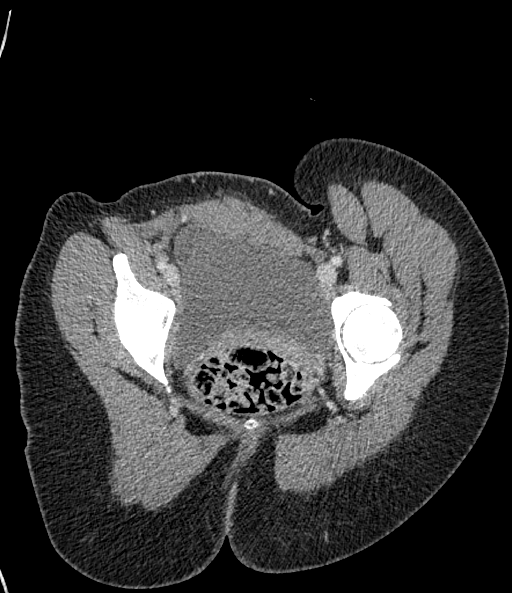
[im 302/518  soft-tissue]
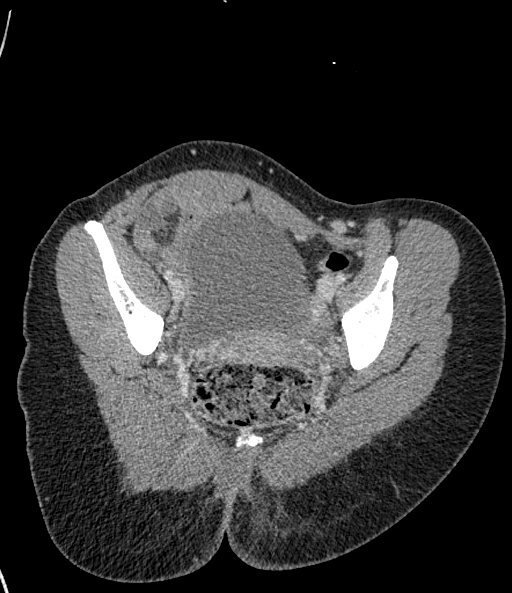
[im 345/518  soft-tissue]
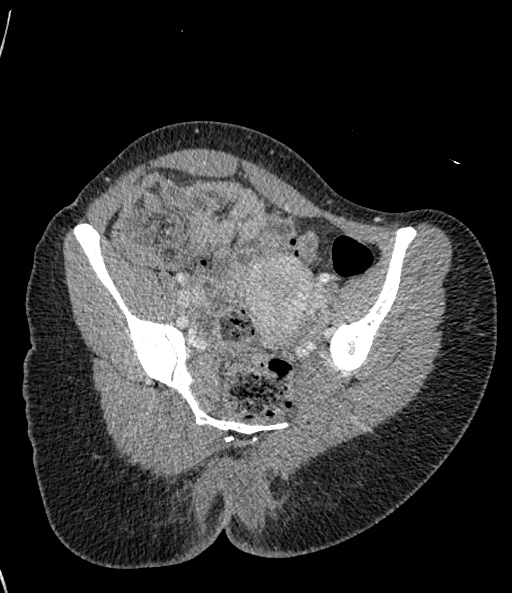
[im 388/518  soft-tissue]
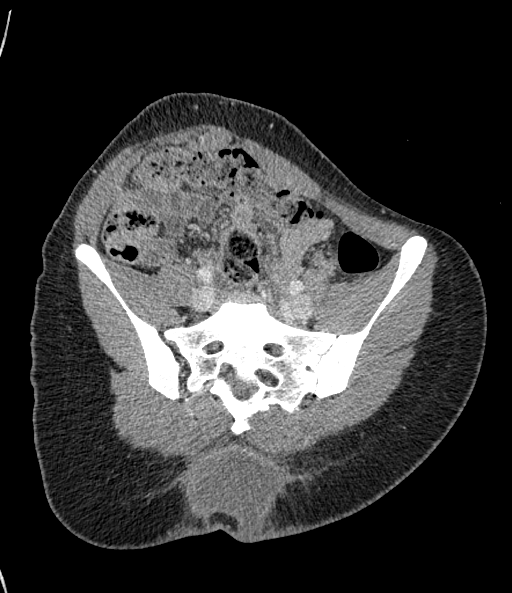
[im 388/518  bone]
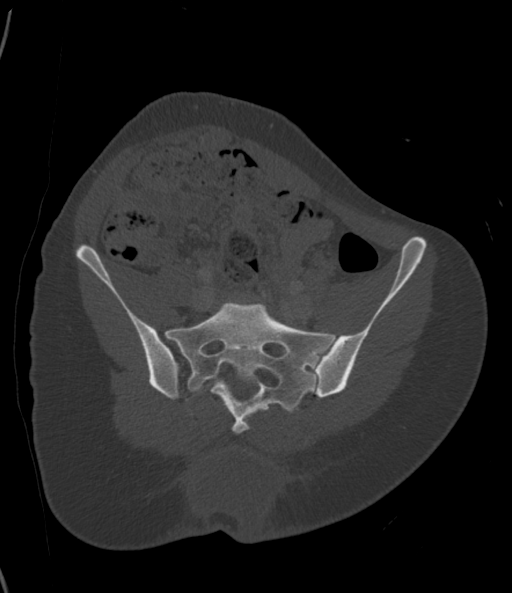
[im 431/518  soft-tissue]
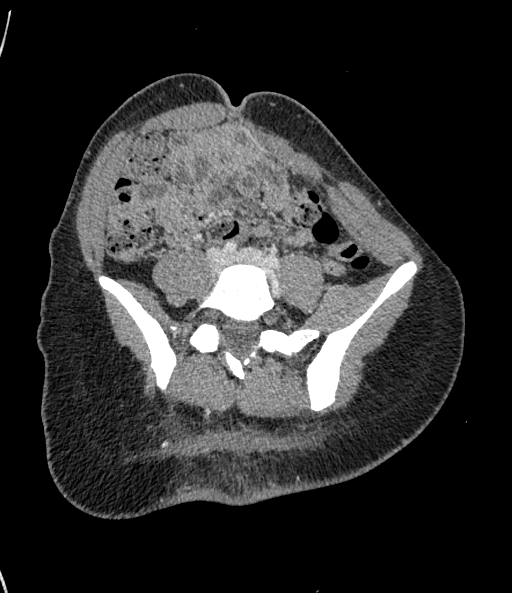
[im 474/518  soft-tissue]
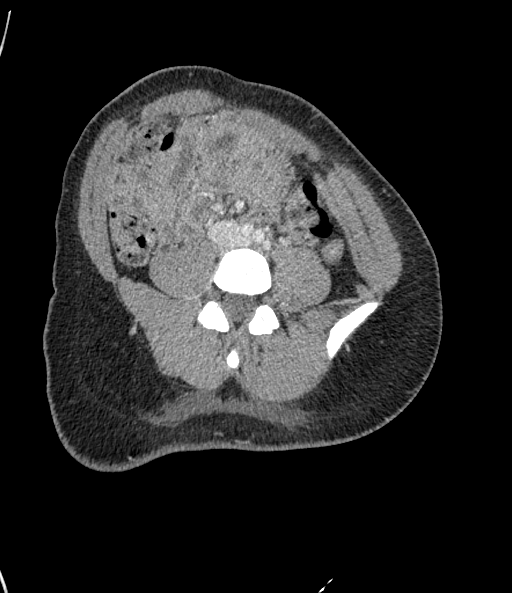

[14 of 46 positions shown; findings below may reference images not displayed]

FINDINGS: Urinary Tract:  No abnormality visualized.

Bowel: Moderate stool within the rectal vault. The visualized bowel
is otherwise unremarkable. No free intraperitoneal fluid.

Vascular/Lymphatic: No pathologically enlarged lymph nodes. No
significant vascular abnormality seen.

Reproductive:  No mass or other significant abnormality

Other: There is a rim enhancing inflammatory collection immediately
superior to the gluteal cleft measuring 5.2 x 6.1 x 5.6 cm most in
keeping with an inflamed pilonidal cyst/abscess. This communicates
with the sacrococcygeal junction inferiorly, best seen at axial
image # 229/11 and sagittal image # 88/10. There is moderate
surrounding subcutaneous edema within the lumbar soft tissues.

Musculoskeletal: No acute bone abnormality. No lytic or blastic bone
lesion.
IMPRESSION: 6.1 cm inflamed loculated subcutaneous fluid collection immediately
superior to the gluteal cleft most in keeping with a inflamed
pilonidal cyst/abscess.
